# Patient Record
Sex: Male | Born: 1990 | Race: Black or African American | Hispanic: No | Marital: Single | Smoking: Current every day smoker
Health system: Southern US, Community
[De-identification: ages and names within clinical notes are randomized; demographics above are authoritative.]

## PROBLEM LIST (undated history)

## (undated) HISTORY — PX: TONSILLECTOMY: SUR1361

---
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
## 2008-12-29 ENCOUNTER — Emergency Department (HOSPITAL_COMMUNITY): Admission: EM | Admit: 2008-12-29 | Discharge: 2008-12-29 | Payer: Self-pay | Admitting: Emergency Medicine

## 2009-02-12 ENCOUNTER — Emergency Department (HOSPITAL_COMMUNITY): Admission: EM | Admit: 2009-02-12 | Discharge: 2009-02-12 | Payer: Self-pay | Admitting: Emergency Medicine

## 2009-05-06 ENCOUNTER — Emergency Department (HOSPITAL_COMMUNITY): Admission: EM | Admit: 2009-05-06 | Discharge: 2009-05-06 | Payer: Self-pay | Admitting: Emergency Medicine

## 2010-04-14 ENCOUNTER — Emergency Department (HOSPITAL_COMMUNITY): Admission: EM | Admit: 2010-04-14 | Discharge: 2010-04-14 | Payer: Self-pay | Admitting: Emergency Medicine

## 2010-08-05 LAB — COMPREHENSIVE METABOLIC PANEL
ALT: 22 U/L (ref 0–53)
AST: 37 U/L (ref 0–37)
Albumin: 4.3 g/dL (ref 3.5–5.2)
Alkaline Phosphatase: 60 U/L (ref 39–117)
CO2: 24 mEq/L (ref 19–32)
GFR calc Af Amer: >60 mL/min (ref >60)
Sodium: 137 mEq/L (ref 135–145)
Total Protein: 8.2 g/dL (ref 6.0–8.3)

## 2010-08-05 LAB — CBC
MCH: 30.8 pg (ref 26.0–34.0)
MCHC: 33.5 g/dL (ref 30.0–36.0)
RDW: 13.1 % (ref 11.5–15.5)

## 2010-08-05 LAB — DIFFERENTIAL
Basophils Absolute: 0 10*3/uL (ref 0.0–0.1)
Eosinophils Absolute: 0 10*3/uL (ref 0.0–0.7)
Monocytes Absolute: 0.2 10*3/uL (ref 0.1–1.0)
Monocytes Relative: 3 % (ref 3–12)

## 2010-08-05 LAB — LIPASE, BLOOD: Lipase: 21 U/L (ref 11–59)

## 2010-08-29 LAB — LIPASE, BLOOD: Lipase: 11 U/L (ref 11–59)

## 2010-08-29 LAB — DIFFERENTIAL
Eosinophils Absolute: 0 10*3/uL (ref 0.0–0.7)
Eosinophils Relative: 0 % (ref 0–5)
Monocytes Absolute: 0 10*3/uL — ABNORMAL LOW (ref 0.1–1.0)
Neutrophils Relative %: 91 % — ABNORMAL HIGH (ref 43–77)

## 2010-08-29 LAB — COMPREHENSIVE METABOLIC PANEL
ALT: 47 U/L (ref 0–53)
Albumin: 4.7 g/dL (ref 3.5–5.2)
CO2: 26 mEq/L (ref 19–32)
Creatinine, Ser: 1.02 mg/dL (ref 0.4–1.5)
GFR calc Af Amer: >60 mL/min (ref >60)
GFR calc non Af Amer: >60 mL/min (ref >60)
Glucose, Bld: 84 mg/dL (ref 70–99)
Sodium: 140 mEq/L (ref 135–145)
Total Protein: 8.4 g/dL — ABNORMAL HIGH (ref 6.0–8.3)

## 2010-08-29 LAB — CBC
Hemoglobin: 16.3 g/dL (ref 13.0–17.0)
MCV: 90.4 fL (ref 78.0–100.0)
RBC: 5.19 MIL/uL (ref 4.22–5.81)

## 2010-08-29 LAB — URINE MICROSCOPIC-ADD ON

## 2010-08-29 LAB — URINALYSIS, ROUTINE W REFLEX MICROSCOPIC
Bilirubin Urine: NEGATIVE
Specific Gravity, Urine: >1.03 — ABNORMAL HIGH (ref 1.005–1.030)
Urobilinogen, UA: 0.2 mg/dL (ref 0.0–1.0)

## 2011-02-20 LAB — RPR: RPR Ser Ql: NONREACTIVE

## 2011-04-02 ENCOUNTER — Encounter: Payer: Self-pay | Admitting: Emergency Medicine

## 2011-04-02 ENCOUNTER — Emergency Department (HOSPITAL_COMMUNITY)
Admission: EM | Admit: 2011-04-02 | Discharge: 2011-04-02 | Disposition: A | Discharge status: Home / Self Care | Payer: Self-pay | Attending: Emergency Medicine | Admitting: Emergency Medicine

## 2011-04-02 DIAGNOSIS — F17200 Nicotine dependence, unspecified, uncomplicated: Secondary | ICD-10-CM | POA: Insufficient documentation

## 2011-04-02 DIAGNOSIS — IMO0001 Reserved for inherently not codable concepts without codable children: Secondary | ICD-10-CM | POA: Insufficient documentation

## 2011-04-02 MED ORDER — DOXYCYCLINE HYCLATE 100 MG PO CAPS: 100.0000 mg | ORAL_CAPSULE | Freq: Two times a day (BID) | ORAL | Status: AC

## 2011-04-02 NOTE — ED Provider Notes (Signed)
History     CSN: [id number] Arrival date & time: 04/02/2011  1:56 PM   First MD Initiated Contact with Patient 04/02/11 1415      Chief Complaint  Patient presents with  . Abscess    (Consider location/radiation/quality/duration/timing/severity/associated sxs/prior treatment) Patient is a 20 y.o. male presenting with abscess. The history is provided by the patient. No language interpreter was used.  Abscess  This is a new problem. The current episode started yesterday. The problem has been unchanged. Affected Location: medial condyle of R elbow and R lower triceps area. The abscess is characterized by redness and swelling. The abscess first occurred at home. Pertinent negatives include no fever. His past medical history does not include atopy in family or skin abscesses in family. He has received no recent medical care.    History reviewed. No pertinent past medical history.  Past Surgical History  Procedure Date  . Tonsillectomy     History reviewed. No pertinent family history.  History  Substance Use Topics  . Smoking status: Current Everyday Smoker  . Smokeless tobacco: Not on file  . Alcohol Use: Yes     monthly      Review of Systems  Constitutional: Negative for fever.  Skin:       Skin lesion   All other systems reviewed and are negative.    Allergies  Review of patient's allergies indicates no known allergies.  Home Medications  No current outpatient prescriptions on file.  BP 127/76  Pulse 61  Temp(Src) 98.4 F (36.9 C) (Oral)  Resp 16  Ht 5\' 11"  (1.803 m)  Wt 142 lb (64.411 kg)  BMI 19.81 kg/m2  SpO2 100%  Physical Exam  Nursing note and vitals reviewed. Constitutional: He is oriented to person, place, and time. He appears well-developed and well-nourished.  HENT:  Head: Normocephalic and atraumatic.  Eyes: EOM are normal.  Neck: Normal range of motion.  Cardiovascular: Normal rate, regular rhythm, normal heart sounds and intact distal  pulses.   Pulmonary/Chest: Effort normal and breath sounds normal. No respiratory distress.  Abdominal: Soft. He exhibits no distension. There is no tenderness.  Musculoskeletal: Normal range of motion.       Right elbow: He exhibits normal range of motion.       Arms: Neurological: He is alert and oriented to person, place, and time.  Skin: Skin is warm and dry.  Psychiatric: He has a normal mood and affect. Judgment normal.    ED Course  Procedures (including critical care time)  Labs Reviewed - No data to display No results found.   No diagnosis found.    MDM          Worthy Rancher, PA 04/02/11 1610

## 2011-04-02 NOTE — ED Notes (Signed)
Pt c/o insect bite to r  Lateral upper arm and r elbow. States did not see an insect. Denies pain but c/o itching and numbness. elbow is red with slgiht swelling, approx size of golf ball. r lateral arm appears like 3 peas in a row and welted. Nad. No draining

## 2011-04-02 NOTE — ED Notes (Signed)
Pt has 2 raised red welt like areas on rt arm 1 on elbow and 1 on the triceps.  Pt states started yesterday and progressed quickly into these raised areas.  Pt denies pain at site however site is warm to touch and itches.

## 2011-04-03 NOTE — ED Provider Notes (Signed)
Medical screening examination/treatment/procedure(s) were performed by non-physician practitioner and as supervising physician I was immediately available for consultation/collaboration.  Tayquan Gassman L Teylor Wolven, MD 04/03/11 0006 

## 2011-07-10 ENCOUNTER — Emergency Department (HOSPITAL_COMMUNITY): Payer: Self-pay

## 2011-07-10 ENCOUNTER — Encounter (HOSPITAL_COMMUNITY): Payer: Self-pay | Admitting: *Deleted

## 2011-07-10 ENCOUNTER — Emergency Department (HOSPITAL_COMMUNITY)
Admission: EM | Admit: 2011-07-10 | Discharge: 2011-07-10 | Disposition: A | Discharge status: Home / Self Care | Payer: Self-pay | Attending: Emergency Medicine | Admitting: Emergency Medicine

## 2011-07-10 DIAGNOSIS — R109 Unspecified abdominal pain: Secondary | ICD-10-CM | POA: Insufficient documentation

## 2011-07-10 DIAGNOSIS — Z23 Encounter for immunization: Secondary | ICD-10-CM | POA: Insufficient documentation

## 2011-07-10 DIAGNOSIS — S335XXA Sprain of ligaments of lumbar spine, initial encounter: Secondary | ICD-10-CM | POA: Insufficient documentation

## 2011-07-10 DIAGNOSIS — S0510XA Contusion of eyeball and orbital tissues, unspecified eye, initial encounter: Secondary | ICD-10-CM | POA: Insufficient documentation

## 2011-07-10 DIAGNOSIS — Z87891 Personal history of nicotine dependence: Secondary | ICD-10-CM | POA: Insufficient documentation

## 2011-07-10 DIAGNOSIS — S39012A Strain of muscle, fascia and tendon of lower back, initial encounter: Secondary | ICD-10-CM

## 2011-07-10 DIAGNOSIS — S20219A Contusion of unspecified front wall of thorax, initial encounter: Secondary | ICD-10-CM | POA: Insufficient documentation

## 2011-07-10 DIAGNOSIS — S0511XA Contusion of eyeball and orbital tissues, right eye, initial encounter: Secondary | ICD-10-CM

## 2011-07-10 DIAGNOSIS — S01111A Laceration without foreign body of right eyelid and periocular area, initial encounter: Secondary | ICD-10-CM

## 2011-07-10 DIAGNOSIS — S0083XA Contusion of other part of head, initial encounter: Secondary | ICD-10-CM

## 2011-07-10 DIAGNOSIS — S0003XA Contusion of scalp, initial encounter: Secondary | ICD-10-CM | POA: Insufficient documentation

## 2011-07-10 DIAGNOSIS — S301XXA Contusion of abdominal wall, initial encounter: Secondary | ICD-10-CM | POA: Insufficient documentation

## 2011-07-10 DIAGNOSIS — S0990XA Unspecified injury of head, initial encounter: Secondary | ICD-10-CM | POA: Insufficient documentation

## 2011-07-10 DIAGNOSIS — S0180XA Unspecified open wound of other part of head, initial encounter: Secondary | ICD-10-CM | POA: Insufficient documentation

## 2011-07-10 LAB — COMPREHENSIVE METABOLIC PANEL
Albumin: 3.8 g/dL (ref 3.5–5.2)
BUN: 8 mg/dL (ref 6–23)
Calcium: 9.5 mg/dL (ref 8.4–10.5)
Creatinine, Ser: 0.99 mg/dL (ref 0.50–1.35)
Total Protein: 7.4 g/dL (ref 6.0–8.3)

## 2011-07-10 LAB — DIFFERENTIAL
Basophils Absolute: 0.1 10*3/uL (ref 0.0–0.1)
Basophils Relative: 1 % (ref 0–1)
Eosinophils Relative: 1 % (ref 0–5)
Monocytes Absolute: 1 10*3/uL (ref 0.1–1.0)

## 2011-07-10 LAB — URINE MICROSCOPIC-ADD ON

## 2011-07-10 LAB — URINALYSIS, ROUTINE W REFLEX MICROSCOPIC
Glucose, UA: NEGATIVE mg/dL
Hgb urine dipstick: NEGATIVE
Ketones, ur: 15 mg/dL — AB
Leukocytes, UA: NEGATIVE
Urobilinogen, UA: 0.2 mg/dL (ref 0.0–1.0)

## 2011-07-10 LAB — CBC
HCT: 44 % (ref 39.0–52.0)
MCHC: 34.8 g/dL (ref 30.0–36.0)
MCV: 90.7 fL (ref 78.0–100.0)
RDW: 12.9 % (ref 11.5–15.5)

## 2011-07-10 LAB — PROTIME-INR
INR: 1.07 (ref 0.00–1.49)
Prothrombin Time: 14.1 seconds (ref 11.6–15.2)

## 2011-07-10 MED ORDER — TETANUS-DIPHTHERIA TOXOIDS TD 5-2 LFU IM INJ
0.5000 mL | INJECTION | Freq: Once | INTRAMUSCULAR | Status: AC
  Administered 2011-07-10: 0.5 mL via INTRAMUSCULAR
  Filled 2011-07-10: qty 0.5

## 2011-07-10 MED ORDER — IBUPROFEN 600 MG PO TABS: 600.0000 mg | ORAL_TABLET | Freq: Three times a day (TID) | ORAL | Status: AC | PRN

## 2011-07-10 MED ORDER — HYDROCODONE-ACETAMINOPHEN 7.5-500 MG/15ML PO SOLN
10.0000 mL | Freq: Once | ORAL | Status: AC
  Administered 2011-07-10: 10 mL via ORAL
  Filled 2011-07-10: qty 15

## 2011-07-10 MED ORDER — IOHEXOL 300 MG/ML  SOLN
100.0000 mL | Freq: Once | INTRAMUSCULAR | Status: AC | PRN
  Administered 2011-07-10: 100 mL via INTRAVENOUS

## 2011-07-10 MED ORDER — HYDROCODONE-ACETAMINOPHEN 5-325 MG PO TABS: 2.0000 | ORAL_TABLET | ORAL | Status: AC | PRN

## 2011-07-10 NOTE — ED Notes (Signed)
Laceration over right eye cleansed with sterile normal saline and peroxide. Patient tolerated well.
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
## 2011-07-10 NOTE — ED Notes (Signed)
Patient with no complaints at this time. Respirations even and unlabored. Skin warm/dry. Discharge instructions reviewed with patient at this time. Patient given opportunity to voice concerns/ask questions. IV removed per policy and band-aid applied to site. Patient discharged at this time and left Emergency Department with steady gait.  
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
## 2011-10-05 ENCOUNTER — Emergency Department (HOSPITAL_COMMUNITY)
Admission: EM | Admit: 2011-10-05 | Discharge: 2011-10-05 | Disposition: A | Discharge status: Home / Self Care | Payer: Self-pay | Attending: Emergency Medicine | Admitting: Emergency Medicine

## 2011-10-05 ENCOUNTER — Encounter (HOSPITAL_COMMUNITY): Payer: Self-pay | Admitting: *Deleted

## 2011-10-05 DIAGNOSIS — F17200 Nicotine dependence, unspecified, uncomplicated: Secondary | ICD-10-CM | POA: Insufficient documentation

## 2011-10-05 DIAGNOSIS — S81851A Open bite, right lower leg, initial encounter: Secondary | ICD-10-CM

## 2011-10-05 DIAGNOSIS — W540XXA Bitten by dog, initial encounter: Secondary | ICD-10-CM | POA: Insufficient documentation

## 2011-10-05 DIAGNOSIS — S91009A Unspecified open wound, unspecified ankle, initial encounter: Secondary | ICD-10-CM | POA: Insufficient documentation

## 2011-10-05 DIAGNOSIS — S81009A Unspecified open wound, unspecified knee, initial encounter: Secondary | ICD-10-CM | POA: Insufficient documentation

## 2011-10-05 DIAGNOSIS — Z23 Encounter for immunization: Secondary | ICD-10-CM | POA: Insufficient documentation

## 2011-10-05 DIAGNOSIS — Y92009 Unspecified place in unspecified non-institutional (private) residence as the place of occurrence of the external cause: Secondary | ICD-10-CM | POA: Insufficient documentation

## 2011-10-05 MED ORDER — AMOXICILLIN-POT CLAVULANATE 875-125 MG PO TABS: 1.0000 | ORAL_TABLET | Freq: Two times a day (BID) | ORAL | Status: AC

## 2011-10-05 MED ORDER — RABIES IMMUNE GLOBULIN 150 UNIT/ML IM INJ
20.0000 [IU]/kg | INJECTION | Freq: Once | INTRAMUSCULAR | Status: AC
  Administered 2011-10-05: 1350 [IU] via INTRAMUSCULAR
  Filled 2011-10-05: qty 9

## 2011-10-05 MED ORDER — RABIES VACCINE, PCEC IM SUSR
1.0000 mL | Freq: Once | INTRAMUSCULAR | Status: AC
  Administered 2011-10-05: 1 mL via INTRAMUSCULAR
  Filled 2011-10-05: qty 1

## 2011-10-05 MED ORDER — BACITRACIN ZINC 500 UNIT/GM EX OINT
TOPICAL_OINTMENT | CUTANEOUS | Status: AC
  Filled 2011-10-05: qty 0.9

## 2011-10-05 MED ORDER — AMOXICILLIN-POT CLAVULANATE 875-125 MG PO TABS
1.0000 | ORAL_TABLET | Freq: Once | ORAL | Status: AC
  Administered 2011-10-05: 1 via ORAL
  Filled 2011-10-05: qty 1
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
## 2011-10-05 NOTE — ED Provider Notes (Signed)
History     CSN: [id number]  Arrival date & time 10/05/11  1451   First MD Initiated Contact with Patient 10/05/11 1611      Chief Complaint  Patient presents with  . Animal Bite    (Consider location/radiation/quality/duration/timing/severity/associated sxs/prior treatment) HPI Comments: Pt was  At his uncle's house.  As he was walking inside he was bit by a "pit bull" that he thinks lives in the neighborhood.  No exact is information is available presently on the dog's owner or the rabies vaccination status.  The pt had a tetanus shot in February 2013.  Patient is a 21 y.o. male presenting with animal bite. The history is provided by the patient. No language interpreter was used.  Animal Bite  Episode onset: yest PM. Pertinent negatives include no numbness and no weakness.    History reviewed. No pertinent past medical history.  Past Surgical History  Procedure Date  . Tonsillectomy     History reviewed. No pertinent family history.  History  Substance Use Topics  . Smoking status: Current Everyday Smoker    Types: Cigarettes  . Smokeless tobacco: Not on file  . Alcohol Use: Yes     monthly      Review of Systems  Constitutional: Negative for fever.  Skin: Positive for wound.  Neurological: Negative for weakness and numbness.  All other systems reviewed and are negative.    Allergies  Review of patient's allergies indicates no known allergies.  Home Medications   Current Outpatient Rx  Name Route Sig Dispense Refill  . AMOXICILLIN-POT CLAVULANATE 875-125 MG PO TABS Oral Take 1 tablet by mouth every 12 (twelve) hours. 14 tablet 0    BP 117/71  Pulse 98  Temp(Src) 98.1 F (36.7 C) (Oral)  Resp 17  Ht 5\' 11"  (1.803 m)  Wt 145 lb 7 oz (65.97 kg)  BMI 20.28 kg/m2  SpO2 98%  Physical Exam  Nursing note and vitals reviewed. Constitutional: He is oriented to person, place, and time. He appears well-developed and well-nourished.  HENT:  Head:  Normocephalic and atraumatic.  Eyes: EOM are normal.  Neck: Normal range of motion.  Cardiovascular: Normal rate, regular rhythm, normal heart sounds and intact distal pulses.   Pulmonary/Chest: Effort normal and breath sounds normal. No respiratory distress.  Abdominal: Soft. He exhibits no distension. There is no tenderness.  Musculoskeletal: Normal range of motion. He exhibits tenderness.       Right knee: He exhibits laceration. He exhibits normal range of motion, no swelling and no effusion.       Legs: Neurological: He is alert and oriented to person, place, and time.  Skin: Skin is warm and dry.  Psychiatric: He has a normal mood and affect. Judgment normal.    ED Course  Procedures (including critical care time)  Labs Reviewed - No data to display No results found.   1. Animal bite of lower leg, right, initial encounter   2. Need for rabies vaccination       MDM  rx- augmentin 875 mg, BID, 14 DT UTD Clean BID with soap and water. F/u with animal control. Complete the remaining rabies series as directed.        Worthy Rancher, PA 10/05/11 [phone]

## 2011-10-05 NOTE — Discharge Instructions (Signed)
Animal Bite Animal bite wounds can get infected. It is important to get proper medical treatment. Ask your doctor if you need a rabies shot. HOME CARE   Follow your doctor's instructions for taking care of your wound.   Only take medicine as told by your doctor.   Take your medicine (antibiotics) as told. Finish them even if you start to feel better.   Keep all doctor visits as told.  You may need a tetanus shot if:   You cannot remember when you had your last tetanus shot.   You have never had a tetanus shot.   The injury broke your skin.  If you need a tetanus shot and you choose not to have one, you may get tetanus. Sickness from tetanus can be serious. GET HELP RIGHT AWAY IF:   Your wound is warm, red, sore, or puffy (swollen).   You notice yellowish-white fluid (pus) or a bad smell coming from the wound.   You see a red line on the skin coming from the wound.   You have a fever, chills, or you feel sick.   You feel sick to your stomach (nauseous), or you throw up (vomit).   Your pain does not go away, or it gets worse.   You have trouble moving the injured part.   You have questions or concerns.  MAKE SURE YOU:   Understand these instructions.   Will watch your condition.   Will get help right away if you are not doing well or get worse.  Document Released: 05/11/2005 Document Revised: 04/30/2011 Document Reviewed: 12/31/2010 [facility] Patient Information 2012 [place], [place].Rabies Vaccine What You Need to Know WHAT IS RABIES?  Rabies is a serious disease. It is caused by a virus.   Rabies is mainly a disease of animals. Humans get rabies when they are bitten by infected animals.   At first there might not be any symptoms. Weeks, or even years after a bite, rabies can cause pain, fatigue, headaches, fever, and irritability. These are followed by seizures, hallucinations, and paralysis. Rabies is almost always fatal.   Wild animals, especially bats, are  common source of human rabies infection. Skunks, raccoons, dogs, cats, coyotes, foxes and other mammals can also transmit the disease.   Human rabies is rare in the [place]. There have been only 55 cases diagnosed since 1990. However, between 16,000 and 39,000 people are vaccinated each year as a precaution after animal bites. Also, rabies is far more common in other parts of the world, with about 40,000 to 70,000 rabies-related deaths worldwide each year. Bites from unvaccinated dogs cause most of these cases.  Rabies vaccine can prevent rabies. RABIES VACCINE  Rabies vaccine is given to people at high risk of rabies to protect them if they are exposed. It can also prevent the disease if it is given to a person after they have been exposed.   Rabies vaccine is made from killed rabies virus. It cannot cause rabies.  WHO SHOULD GET RABIES VACCINE AND WHEN? Preventive Vaccination (No Exposure)  People at high risk of exposure to rabies, such as veterinarians, Educational psychologist, rabies laboratory workers, spelunkers, and rabies biologics production workers should be offered rabies vaccine.   The vaccine should also be considered for:   People whose activities bring them into frequent contact with rabies virus or with possibly rabid animals.   International travelers who are likely to come in contact with animals in parts of the world where rabies is common.  The pre-exposure schedule for rabies vaccination is 3 doses, given at the following times:   Dose 1: As appropriate.   Dose 2: 7 days after Dose 1.   Dose 3: 21 days or 28 days after Dose 1.   For laboratory workers and others who may be repeatedly exposed to rabies virus, periodic testing for immunity is recommended. Booster doses should be given as needed. (Testing or booster doses are not recommended for travelers). Ask your caregiver for details.  Vaccination After an Exposure Anyone who has been bitten by an animal, or who  otherwise may have been exposed to rabies, should see a caregiver immediately. The caregiver will determine if he or she needs to be vaccinated.  A person who is exposed and has never been vaccinated against rabies should get 4 doses of rabies vaccine.One dose right away and additional doses on the 3rd, 7th, and 14th days. They should also get another shot called RabiesImmuneGlobulin at the same time as the first dose.   A person who has been previously vaccinated should get 2 doses of rabies vaccine. One right away and another on the 3rd day. Rabies Immune Globulin is not needed.  TELL YOUR CAREGIVER IF: Talk with a caregiver before getting rabies vaccine if you:  Ever had a serious (life-threatening) allergic reaction to a previous dose of rabies vaccine or to any component of the vaccine.   Have any severe allergies.   Have a weakened immune system because of:   HIV, AIDS, or another disease that affects the immune system.   Treatment with drugs that affect the immune system, such as steroids.   Cancer or cancer treatment with radiation or drugs.  If you have a minor illnesses, such as a cold, you can be vaccinated. If you are moderately or severely ill, wait until you recover before getting a routine (non-exposure) dose of rabies vaccine. If you have been exposed to rabies virus, you should get the vaccine regardless of any other illnesses you may have. WHAT ARE THE RISKS FROM RABIES VACCINE? A vaccine, like any medicine, is capable of causing serious problems, such as severe allergic reactions. The risk of a vaccine causing serious harm, or death, is extremely small. Serious problems from rabies vaccine are very rare.  Mild problems:  Soreness, redness, swelling, or itching where the shot was given (30 % to 74 %).   Headache, nausea, abdominal pain, muscle aches, or dizziness (5% to 40 %).  Moderate problems:  Hives, pain in the joints, or fever (about 6 % of booster doses).    Other nervous system disorders, such as Guillain-Barr Syndrome (GBS), have been reported after rabies vaccine. This happens so rarely that it is not known whether they are related to the vaccine.  Note: Several brands of rabies vaccine are available in the [place], and reactions may vary between brands. Your caregiver can give you more information about a particular brand. WHAT IF THERE IS A MODERATE OR SEVERE REACTION? What should I look for? Any unusual condition, such as a severe allergic reaction or a high fever. If a severe allergic reaction occurred, it would be within a few minutes to an hour after the shot. Signs of a serious allergic reaction can include difficulty breathing, weakness, hoarseness or wheezing, a fast heartbeat, hives, dizziness, paleness, or swelling of the throat. What should I do?  Call your caregiver or get the person to a caregiver right away.   Tell the caregiver what happened, the  date and time it happened, and when the vaccination was given.   Ask the caregiver, nurse, or health department to report the reaction by filing a Vaccine Adverse Event Reporting System (VAERS) form. Or, you can file this report through the VAERS website at www.vaers.[place]gents.no or by calling 1-(423)551-7699.  VAERS does not provide medical advice. HOW CAN I LEARN MORE?  Ask your caregiver or other health care provider. He or she can give you the vaccine package insert or suggest other sources of information.   Call your local or state health department.   Contact the Centers for Disease Control and Prevention (CDC):   Visit the CDC rabies website at [place].no  CDC Rabies Vaccine VIS (02/28/08) Document Released: 03/08/2006 Document Revised: 04/30/2011 Document Reviewed: 02/28/2008 [facility] Patient Information 2012 [place], [place].   Take the antibiotic as directed.  You need to complete the rabies vaccination series as outlined.  Stay in touch with animal  control.  Return to the ED if any signs of infection at the site of the bite.

## 2011-10-05 NOTE — ED Notes (Signed)
R. Miller, PA at bedside  

## 2011-10-05 NOTE — ED Notes (Signed)
[employer] called and an officer is to be sent here to speak with pt

## 2011-10-08 ENCOUNTER — Ambulatory Visit (HOSPITAL_COMMUNITY): Payer: Self-pay

## 2011-10-08 NOTE — ED Provider Notes (Signed)
Medical screening examination/treatment/procedure(s) were performed by non-physician practitioner and as supervising physician I was immediately available for consultation/collaboration.  Yumna Ebers, MD 10/08/11 0716 

## 2011-10-12 ENCOUNTER — Ambulatory Visit (HOSPITAL_COMMUNITY): Payer: Self-pay

## 2011-10-20 ENCOUNTER — Ambulatory Visit (HOSPITAL_COMMUNITY): Payer: Self-pay

## 2015-02-04 ENCOUNTER — Encounter (HOSPITAL_COMMUNITY): Payer: Self-pay

## 2015-02-04 ENCOUNTER — Emergency Department (HOSPITAL_COMMUNITY)
Admission: EM | Admit: 2015-02-04 | Discharge: 2015-02-04 | Discharge status: Against Medical Advice | Payer: Self-pay | Attending: Emergency Medicine | Admitting: Emergency Medicine

## 2015-02-04 DIAGNOSIS — Y9389 Activity, other specified: Secondary | ICD-10-CM | POA: Insufficient documentation

## 2015-02-04 DIAGNOSIS — Y9289 Other specified places as the place of occurrence of the external cause: Secondary | ICD-10-CM | POA: Insufficient documentation

## 2015-02-04 DIAGNOSIS — F1410 Cocaine abuse, uncomplicated: Secondary | ICD-10-CM | POA: Insufficient documentation

## 2015-02-04 DIAGNOSIS — Y998 Other external cause status: Secondary | ICD-10-CM | POA: Insufficient documentation

## 2015-02-04 DIAGNOSIS — F32A Depression, unspecified: Secondary | ICD-10-CM

## 2015-02-04 DIAGNOSIS — Z720 Tobacco use: Secondary | ICD-10-CM | POA: Insufficient documentation

## 2015-02-04 DIAGNOSIS — F329 Major depressive disorder, single episode, unspecified: Secondary | ICD-10-CM | POA: Insufficient documentation

## 2015-02-04 DIAGNOSIS — F1210 Cannabis abuse, uncomplicated: Secondary | ICD-10-CM | POA: Insufficient documentation

## 2015-02-04 LAB — COMPREHENSIVE METABOLIC PANEL
ALK PHOS: 56 U/L (ref 38–126)
ALT: 12 U/L — ABNORMAL LOW (ref 17–63)
ANION GAP: 5 (ref 5–15)
AST: 18 U/L (ref 15–41)
Albumin: 4.1 g/dL (ref 3.5–5.0)
BILIRUBIN TOTAL: 1.5 mg/dL — AB (ref 0.3–1.2)
BUN: 11 mg/dL (ref 6–20)
CALCIUM: 8.6 mg/dL — AB (ref 8.9–10.3)
CO2: 25 mmol/L (ref 22–32)
Chloride: 108 mmol/L (ref 101–111)
Creatinine, Ser: 1.39 mg/dL — ABNORMAL HIGH (ref 0.61–1.24)
GFR calc Af Amer: >60 mL/min (ref >60)
GLUCOSE: 108 mg/dL — AB (ref 65–99)
POTASSIUM: 3.8 mmol/L (ref 3.5–5.1)
Sodium: 138 mmol/L (ref 135–145)
TOTAL PROTEIN: 7.1 g/dL (ref 6.5–8.1)

## 2015-02-04 LAB — CBC
HEMATOCRIT: 43.5 % (ref 39.0–52.0)
HEMOGLOBIN: 15 g/dL (ref 13.0–17.0)
MCH: 31.7 pg (ref 26.0–34.0)
MCHC: 34.5 g/dL (ref 30.0–36.0)
MCV: 92 fL (ref 78.0–100.0)
Platelets: 201 10*3/uL (ref 150–400)
RBC: 4.73 MIL/uL (ref 4.22–5.81)
RDW: 12.6 % (ref 11.5–15.5)
WBC: 4.5 10*3/uL (ref 4.0–10.5)

## 2015-02-04 LAB — ACETAMINOPHEN LEVEL: Acetaminophen (Tylenol), Serum: <10 ug/mL — ABNORMAL LOW (ref 10–30)

## 2015-02-04 LAB — RAPID URINE DRUG SCREEN, HOSP PERFORMED
AMPHETAMINES: NOT DETECTED
Barbiturates: NOT DETECTED
Benzodiazepines: NOT DETECTED
Cocaine: POSITIVE — AB
OPIATES: NOT DETECTED
TETRAHYDROCANNABINOL: POSITIVE — AB

## 2015-02-04 LAB — ETHANOL

## 2015-02-04 LAB — SALICYLATE LEVEL

## 2015-02-04 NOTE — ED Notes (Signed)
Pt resting, equal rise and fall of chest noted.

## 2015-02-04 NOTE — BH Assessment (Addendum)
Tele Assessment Note   Aaron Francis is an 24 y.o. male who presents voluntarily to [facility] with complaints of feeling weak and feeling like his inside body temperature was very hot. Pt admits to talking 15 pills of an unknown rx with intent to kill himself. Pt states that the pills could have been from his finance or her son, both of whom he lives with. He discloses that both of them take psychotropic meds. Pt shares that the bottle was unlabeled.   Pt discloses feeling overwhelmed with normal life issues that have compounded on him at once. Pt endorses feeling some depression with symptoms of anger/irritation, worthlessness, and anhedonia. Pt denies any current SI/HI or AVH and indicates that he would just like to go home. Pt reports no hx of mental health treatment. Pt indicates no drug/alcohol abuse or treatment hx.   Pt is oriented x 4. His mood is pleasant, but depressed.   Axis I: 296.23 Major depressive disorder, Single episode, Severe Axis II: Deferred Axis III: History reviewed. No pertinent past medical history. Axis IV: other psychosocial or environmental problems Axis V: 11-20 some danger of hurting self or others possible OR occasionally fails to maintain minimal personal hygiene OR gross impairment in communication  Past Medical History: History reviewed. No pertinent past medical history.  Past Surgical History  Procedure Laterality Date  . Tonsillectomy      Family History: History reviewed. No pertinent family history.  Social History:  reports that he has been smoking Cigarettes.  He does not have any smokeless tobacco history on file. He reports that he drinks alcohol. He reports that he does not use illicit drugs.  Additional Social History:  Alcohol / Drug Use Pain Medications: see MAR Prescriptions: see MAR Over the Counter: see MAR History of alcohol / drug use?: No history of alcohol / drug abuse  CIWA: CIWA-Ar BP: 113/65 mmHg Pulse Rate: 66 COWS:     PATIENT STRENGTHS: (choose at least two) Average or above average intelligence Capable of independent living Communication skills Motivation for treatment/growth Supportive family/friends  Allergies: No Known Allergies  Home Medications:  (Not in a hospital admission)  OB/GYN Status:  No LMP for male patient.  General Assessment Data Location of Assessment: [facility] TTS Assessment: In system Is this a Tele or Face-to-Face Assessment?: Tele Assessment Is this an Initial Assessment or a Re-assessment for this encounter?: Initial Assessment Marital status: Long term relationship (engaged) Is patient pregnant?: No Pregnancy Status: No Living Arrangements: Spouse/significant other, Children Can pt return to current living arrangement?: Yes Admission Status: Voluntary Is patient capable of signing voluntary admission?: Yes Referral Source: Self/Family/Friend Insurance type: none  Medical Screening Exam [facility] Walk-in ONLY) Medical Exam completed: Yes  Crisis Care Plan Living Arrangements: Spouse/significant other, Children Name of Psychiatrist: none Name of Therapist: none  Education Status Is patient currently in school?: No  Risk to self with the past 6 months Suicidal Ideation: No-Not Currently/Within Last 6 Months Has patient been a risk to self within the past 6 months prior to admission? : Yes Suicidal Intent: No-Not Currently/Within Last 6 Months Has patient had any suicidal intent within the past 6 months prior to admission? : Yes Is patient at risk for suicide?: Yes Suicidal Plan?: No-Not Currently/Within Last 6 Months Has patient had any suicidal plan within the past 6 months prior to admission? : Yes Access to Means: Yes Specify Access to Suicidal Means: fiance and fiance's son has MH rxs What has been your use  of drugs/alcohol within the last 12 months?: pt reports no hx Previous Attempts/Gestures: Yes How many times?: 1 Other Self Harm Risks: none Triggers  for Past Attempts: Other (Comment) (overwhelming life circumstances) Intentional Self Injurious Behavior: None Family Suicide History: No Recent stressful life event(s): Other (Comment) (compounding normal life issues) Persecutory voices/beliefs?: No Depression: Yes Depression Symptoms: Feeling angry/irritable, Feeling worthless/self pity, Loss of interest in usual pleasures Substance abuse history and/or treatment for substance abuse?: No Suicide prevention information given to non-admitted patients: Not applicable  Risk to Others within the past 6 months Homicidal Ideation: No Does patient have any lifetime risk of violence toward others beyond the six months prior to admission? : No Thoughts of Harm to Others: No Current Homicidal Intent: No Current Homicidal Plan: No Access to Homicidal Means: No History of harm to others?: No Assessment of Violence: None Noted Does patient have access to weapons?: No Criminal Charges Pending?: No Does patient have a court date: No Is patient on probation?: No  Psychosis Hallucinations: None noted Delusions: None noted  Mental Status Report Appearance/Hygiene: Unremarkable Eye Contact: Good Motor Activity: Unremarkable Speech: Unremarkable, Logical/coherent Level of Consciousness: Quiet/awake Mood: Depressed, Pleasant Affect: Depressed, Appropriate to circumstance, Anxious Anxiety Level: Minimal Thought Processes: Coherent, Relevant Judgement: Partial Orientation: Person, Place, Time, Situation Obsessive Compulsive Thoughts/Behaviors: None  Cognitive Functioning Concentration: Normal Memory: Recent Intact, Remote Intact IQ: Average Insight: Fair Impulse Control: Fair Appetite: Good Sleep: Decreased Vegetative Symptoms: None  ADLScreening [facility] Assessment Services) Patient's cognitive ability adequate to safely complete daily activities?: Yes Patient able to express need for assistance with ADLs?: Yes Independently performs  ADLs?: Yes (appropriate for developmental age)  Prior Inpatient Therapy Prior Inpatient Therapy: No  Prior Outpatient Therapy Prior Outpatient Therapy: No Does patient have an ACCT team?: No Does patient have Intensive In-House Services?  : No Does patient have [facility] services? : No Does patient have P4CC services?: No  ADL Screening (condition at time of admission) Patient's cognitive ability adequate to safely complete daily activities?: Yes Is the patient deaf or have difficulty hearing?: No Does the patient have difficulty seeing, even when wearing glasses/contacts?: No Does the patient have difficulty concentrating, remembering, or making decisions?: No Patient able to express need for assistance with ADLs?: Yes Does the patient have difficulty dressing or bathing?: No Independently performs ADLs?: Yes (appropriate for developmental age) Does the patient have difficulty walking or climbing stairs?: No Weakness of Legs: None Weakness of Arms/Hands: None  Home Assistive Devices/Equipment Home Assistive Devices/Equipment: None  Therapy Consults (therapy consults require a physician order) PT Evaluation Needed: No OT Evalulation Needed: No SLP Evaluation Needed: No Abuse/Neglect Assessment (Assessment to be complete while patient is alone) Physical Abuse: Denies Verbal Abuse: Denies Sexual Abuse: Denies Exploitation of patient/patient's resources: Denies Self-Neglect: Denies Values / Beliefs Spiritual Requests During Hospitalization: None Consults Spiritual Care Consult Needed: No Social Work Consult Needed: No Merchant navy officer (For Healthcare) Does patient have an advance directive?: No    Additional Information 1:1 In Past 12 Months?: No CIRT Risk: No Elopement Risk: No Does patient have medical clearance?: Yes     Disposition:  Disposition Initial Assessment Completed for this Encounter: Yes Disposition of Patient: Inpatient treatment program (per  Claudette Head, DNP) Type of inpatient treatment program: Adult  Laddie Aquas 02/04/2015 9:20 AM

## 2015-02-04 NOTE — ED Provider Notes (Signed)
CSN: [id number]     Arrival date & time 02/04/15  4098 History  This chart was scribed for Azalia Bilis, MD by Marica Otter, ED Scribe. This patient was seen in room APA16A/AP[facility]16A and the patient's care was started at 8:02 AM.   Chief Complaint  Patient presents with  . Ingestion   The history is provided by the patient and a friend Soil scientist ).   PCP: No primary care provider on file. HPI Comments: HAARIS METALLO is a 24 y.o. male, with PMHx noted below, who presents to the Emergency Department complaining of ingesting 15 unknown pills last night because he was depressed and suicidal. Pt reports the pills belonged to his fiance. Pt denies any prior Hx of suicide attempts. Pt denies drug and EtOH use. Today, pt complains of generalized weakness onset this morning. Pt denies n/v. Per pt's fiance the her meds include: Abilify, cymbalata, ibuprofen, cholesterol med, trazodone (fluid pills).   History reviewed. No pertinent past medical history. Past Surgical History  Procedure Laterality Date  . Tonsillectomy     No family history on file. Social History  Substance Use Topics  . Smoking status: Current Every Day Smoker    Types: Cigarettes  . Smokeless tobacco: None  . Alcohol Use: Yes     Comment: occ    Review of Systems  Constitutional: Negative for fever and chills.  Gastrointestinal: Negative for nausea and vomiting.  Neurological: Positive for weakness.  Psychiatric/Behavioral: Negative for suicidal ideas.  All other systems reviewed and are negative.  Allergies  Review of patient's allergies indicates no known allergies.  Home Medications   Prior to Admission medications   Not on File   Triage Vitals: BP 113/65 mmHg  Pulse 66  Temp(Src) 97.8 F (36.6 C) (Oral)  Resp 18  Ht  (1.803 m)  Wt 150 lb (68.04 kg)  BMI 20.93 kg/m2  SpO2 98% Physical Exam  Constitutional: He is oriented to person, place, and time. He appears well-developed and well-nourished.   HENT:  Head: Normocephalic and atraumatic.  Eyes: EOM are normal.  Neck: Normal range of motion.  Cardiovascular: Normal rate, regular rhythm, normal heart sounds and intact distal pulses.   Pulmonary/Chest: Effort normal and breath sounds normal. No respiratory distress.  Abdominal: Soft. He exhibits no distension. There is no tenderness.  Musculoskeletal: Normal range of motion.  Neurological: He is alert and oriented to person, place, and time.  Skin: Skin is warm and dry.  Psychiatric: He has a normal mood and affect. Judgment normal.  Nursing note and vitals reviewed.   ED Course  Procedures (including critical care time) DIAGNOSTIC STUDIES: Oxygen Saturation is 98% on RA, nl by my interpretation.    COORDINATION OF CARE: 8:05 AM: Discussed treatment plan which includes labs and psych consult with pt at bedside; patient verbalizes understanding and agrees with treatment plan.  Labs Review Labs Reviewed  COMPREHENSIVE METABOLIC PANEL - Abnormal; Notable for the following:    Glucose, Bld 108 (*)    Creatinine, Ser 1.39 (*)    Calcium 8.6 (*)    ALT 12 (*)    Total Bilirubin 1.5 (*)    All other components within normal limits  ACETAMINOPHEN LEVEL - Abnormal; Notable for the following:    Acetaminophen (Tylenol), Serum <10 (*)    All other components within normal limits  URINE RAPID DRUG SCREEN, HOSP PERFORMED - Abnormal; Notable for the following:    Cocaine POSITIVE (*)    Tetrahydrocannabinol POSITIVE (*)  All other components within normal limits  CBC  SALICYLATE LEVEL  ETHANOL       MDM   Final diagnoses:  None    Medically clear. TTS to evaluate  I personally performed the services described in this documentation, which was scribed in my presence. The recorded information has been reviewed and is accurate.      Azalia Bilis, MD 02/04/15 [phone]

## 2015-02-04 NOTE — [facility] Counselor (Signed)
[facility] Assessment Progress Note  Per Claudette Head, DNP, pt meets criteria for IP hospitalization. No available beds at [facility]. TTS to seek placement. Counselor advised pt's nurse, Lauren, of the disposition and she agreed to inform EDP, Dr. Patria Mane.   Johny Shock. Ladona Ridgel, MS, NCC, LPCA Counselor

## 2015-02-04 NOTE — Progress Notes (Signed)
Seeking inpatient placement for pt.  Referred to: [facility]- per Adonis Brook- per [facility]- per [facility] per Dina  Left voicemail with [facility] inquiring as to bed status- will refer if appropriate. Other facilities contacted are currently at capacity.  Ilean Skill, MSW, LCSW Clinical Social Work, Disposition  02/04/2015 [phone]

## 2015-02-04 NOTE — ED Notes (Signed)
Pt made aware of policies regarding making pt IVC if he tries to leave. Pt is ok with staying at this time. Pt is currently voluntary.

## 2015-02-04 NOTE — ED Notes (Signed)
MD made aware and re-evaluating the patient.

## 2015-02-04 NOTE — ED Notes (Signed)
Pt has family member at bedside.   Pt and family now want pt to go home because he has a job to go to tomorrow. Pt made aware that he has had SI intentions in the past 24 hours and it is not safe for him to go home.   Pt and family made aware that pt is now voluntary but he will become IVC'd if he doesn't cooperate with staff and makes threats to leave.  Charge nurse made aware and currently talking with pt and family member. Security at nurses station.

## 2015-02-04 NOTE — ED Notes (Addendum)
Pt dressed in wine scrubs and wanded by security. Pt ambulatory to bathroom.

## 2015-02-04 NOTE — ED Notes (Signed)
Pt states he was not going to stay because he had to work tomorrow and needed to go so he could pay child support. We explained to him that if he tried to leave that we would have to take out IVC paperwork on him. Pt stated he would "take his chances with the IVC paperwork" and promptly got up and left the building. Paperwork started by EDP. [employer] notified to bring pt back as he was now under IVC.

## 2015-02-04 NOTE — ED Notes (Signed)
Pt resting quietly with NAD noted. Lights turned off for patient to rest.

## 2015-02-04 NOTE — ED Notes (Signed)
Spoke with marcus at [facility] to update him on pt's IVC status.

## 2015-02-04 NOTE — ED Notes (Signed)
Aaron Francis at [facility] made aware of the situation and pt and facility request for re-TTS. Minerva Areola reports to place the order in now and TTS consult won't be made again until atleast 2000

## 2015-02-04 NOTE — ED Notes (Signed)
Aaron Francis [phone]

## 2015-02-04 NOTE — ED Notes (Signed)
Pt given meal tray.   Pt has one visitor at bedside

## 2015-02-04 NOTE — ED Notes (Signed)
Pt reports was feeling depressed last night and took 15 tablets of an unlabeled medication.  Pt says he took the medication around 1930 last night.  C/o generalized weakness, headache, feeling like is "hot on the inside."    Pt says he no longer wants to hurt himself.

## 2015-02-04 NOTE — Progress Notes (Addendum)
Referral was followed up at: [facility]- per [place], at capacity today, referrals have not been reviewed yet, call back in am. [facility] - per Dyn, fax referral over, beds open. [facility] - per Misty Stanley, will call writer back with updates. [facility] - per intake, fax referral for the waitlist. [facility] - left voicemail.  Declined at: [facility]   At capacity: [address] [facility]   CSW will continue to seek placement.  Melbourne Abts, LCSWA Disposition staff 02/04/2015 6:37 PM

## 2015-02-04 NOTE — ED Notes (Signed)
TTS machine placed at bedside.   

## 2015-02-04 NOTE — ED Notes (Signed)
[facility] called, they will seek inpatient for patient.

## 2015-02-04 NOTE — ED Notes (Signed)
MD at bedside. 

## 2015-06-11 ENCOUNTER — Encounter (HOSPITAL_COMMUNITY): Payer: Self-pay | Admitting: Emergency Medicine

## 2015-06-11 ENCOUNTER — Emergency Department (HOSPITAL_COMMUNITY)
Admission: EM | Admit: 2015-06-11 | Discharge: 2015-06-11 | Disposition: A | Discharge status: Home / Self Care | Payer: Self-pay | Attending: Emergency Medicine | Admitting: Emergency Medicine

## 2015-06-11 DIAGNOSIS — Z008 Encounter for other general examination: Secondary | ICD-10-CM | POA: Insufficient documentation

## 2015-06-11 DIAGNOSIS — F17210 Nicotine dependence, cigarettes, uncomplicated: Secondary | ICD-10-CM | POA: Insufficient documentation

## 2015-06-11 NOTE — ED Notes (Signed)
Since here by [facility].

## 2015-06-11 NOTE — ED Notes (Signed)
Here for medical clearance.  Using THC, ETOH, and cocaine.  Last used on Friday.

## 2015-06-11 NOTE — Discharge Instructions (Signed)
Health Maintenance, Male A healthy lifestyle and preventative care can promote health and wellness.  Maintain regular health, dental, and eye exams.  Eat a healthy diet. Foods like vegetables, fruits, whole grains, low-fat dairy products, and lean protein foods contain the nutrients you need and are low in calories. Decrease your intake of foods high in solid fats, added sugars, and salt. Get information about a proper diet from your health care provider, if necessary.  Regular physical exercise is one of the most important things you can do for your health. Most adults should get at least 150 minutes of moderate-intensity exercise (any activity that increases your heart rate and causes you to sweat) each week. In addition, most adults need muscle-strengthening exercises on 2 or more days a week.   Maintain a healthy weight. The body mass index (BMI) is a screening tool to identify possible weight problems. It provides an estimate of body fat based on height and weight. Your health care provider can find your BMI and can help you achieve or maintain a healthy weight. For males 20 years and older:  A BMI below 18.5 is considered underweight.  A BMI of 18.5 to 24.9 is normal.  A BMI of 25 to 29.9 is considered overweight.  A BMI of 30 and above is considered obese.  Maintain normal blood lipids and cholesterol by exercising and minimizing your intake of saturated fat. Eat a balanced diet with plenty of fruits and vegetables. Blood tests for lipids and cholesterol should begin at age 79 and be repeated every 5 years. If your lipid or cholesterol levels are high, you are over age 83, or you are at high risk for heart disease, you may need your cholesterol levels checked more frequently.Ongoing high lipid and cholesterol levels should be treated with medicines if diet and exercise are not working.  If you smoke, find out from your health care provider how to quit. If you do not use tobacco, do not  start.  Lung cancer screening is recommended for adults aged 55-80 years who are at high risk for developing lung cancer because of a history of smoking. A yearly low-dose CT scan of the lungs is recommended for people who have at least a 30-pack-year history of smoking and are current smokers or have quit within the past 15 years. A pack year of smoking is smoking an average of 1 pack of cigarettes a day for 1 year (for example, a 30-pack-year history of smoking could mean smoking 1 pack a day for 30 years or 2 packs a day for 15 years). Yearly screening should continue until the smoker has stopped smoking for at least 15 years. Yearly screening should be stopped for people who develop a health problem that would prevent them from having lung cancer treatment.  If you choose to drink alcohol, do not have more than 2 drinks per day. One drink is considered to be 12 oz (360 mL) of beer, 5 oz (150 mL) of wine, or 1.5 oz (45 mL) of liquor.  Avoid the use of street drugs. Do not share needles with anyone. Ask for help if you need support or instructions about stopping the use of drugs.  High blood pressure causes heart disease and increases the risk of stroke. High blood pressure is more likely to develop in:  People who have blood pressure in the end of the normal range (100-139/85-89 mm Hg).  People who are overweight or obese.  People who are African American.  If you are 18-39 years of age, have your blood pressure checked every 3-5 years. If you are 40 years of age or older, have your blood pressure checked every year. You should have your blood pressure measured twice--once when you are at a hospital or clinic, and once when you are not at a hospital or clinic. Record the average of the two measurements. To check your blood pressure when you are not at a hospital or clinic, you can use:  An automated blood pressure machine at a pharmacy.  A home blood pressure monitor.  If you are 45-79 years  old, ask your health care provider if you should take aspirin to prevent heart disease.  Diabetes screening involves taking a blood sample to check your fasting blood sugar level. This should be done once every 3 years after age 45 if you are at a normal weight and without risk factors for diabetes. Testing should be considered at a younger age or be carried out more frequently if you are overweight and have at least 1 risk factor for diabetes.  Colorectal cancer can be detected and often prevented. Most routine colorectal cancer screening begins at the age of 50 and continues through age 75. However, your health care provider may recommend screening at an earlier age if you have risk factors for colon cancer. On a yearly basis, your health care provider may provide home test kits to check for hidden blood in the stool. A small camera at the end of a tube may be used to directly examine the colon (sigmoidoscopy or colonoscopy) to detect the earliest forms of colorectal cancer. Talk to your health care provider about this at age 50 when routine screening begins. A direct exam of the colon should be repeated every 5-10 years through age 75, unless early forms of precancerous polyps or small growths are found.  People who are at an increased risk for hepatitis B should be screened for this virus. You are considered at high risk for hepatitis B if:  You were born in a country where hepatitis B occurs often. Talk with your health care provider about which countries are considered high risk.  Your parents were born in a high-risk country and you have not received a shot to protect against hepatitis B (hepatitis B vaccine).  You have HIV or AIDS.  You use needles to inject street drugs.  You live with, or have sex with, someone who has hepatitis B.  You are a man who has sex with other men (MSM).  You get hemodialysis treatment.  You take certain medicines for conditions like cancer, organ  transplantation, and autoimmune conditions.  Hepatitis C blood testing is recommended for all people born from 1945 through 1965 and any individual with known risk factors for hepatitis C.  Healthy men should no longer receive prostate-specific antigen (PSA) blood tests as part of routine cancer screening. Talk to your health care provider about prostate cancer screening.  Testicular cancer screening is not recommended for adolescents or adult males who have no symptoms. Screening includes self-exam, a health care provider exam, and other screening tests. Consult with your health care provider about any symptoms you have or any concerns you have about testicular cancer.  Practice safe sex. Use condoms and avoid high-risk sexual practices to reduce the spread of sexually transmitted infections (STIs).  You should be screened for STIs, including gonorrhea and chlamydia if:  You are sexually active and are younger than 24 years.  You   are older than 24 years, and your health care provider tells you that you are at risk for this type of infection.  Your sexual activity has changed since you were last screened, and you are at an increased risk for chlamydia or gonorrhea. Ask your health care provider if you are at risk.  If you are at risk of being infected with HIV, it is recommended that you take a prescription medicine daily to prevent HIV infection. This is called pre-exposure prophylaxis (PrEP). You are considered at risk if:  You are a man who has sex with other men (MSM).  You are a heterosexual man who is sexually active with multiple partners.  You take drugs by injection.  You are sexually active with a partner who has HIV.  Talk with your health care provider about whether you are at high risk of being infected with HIV. If you choose to begin PrEP, you should first be tested for HIV. You should then be tested every 3 months for as long as you are taking PrEP.  Use sunscreen. Apply  sunscreen liberally and repeatedly throughout the day. You should seek shade when your shadow is shorter than you. Protect yourself by wearing long sleeves, pants, a wide-brimmed hat, and sunglasses year round whenever you are outdoors.  Tell your health care provider of new moles or changes in moles, especially if there is a change in shape or color. Also, tell your health care provider if a mole is larger than the size of a pencil eraser.  A one-time screening for abdominal aortic aneurysm (AAA) and surgical repair of large AAAs by ultrasound is recommended for men aged 65-75 years who are current or former smokers.  Stay current with your vaccines (immunizations).   This information is not intended to replace advice given to you by your health care provider. Make sure you discuss any questions you have with your health care provider.   Document Released: 11/07/2007 Document Revised: 06/01/2014 Document Reviewed: 10/06/2010 Elsevier Interactive Patient Education 2016 [employer].    Emergency Department Resource Guide 1) Find a Doctor and Pay Out of Pocket Although you won't have to find out who is covered by your insurance plan, it is a good idea to ask around and get recommendations. You will then need to call the office and see if the doctor you have chosen will accept you as a new patient and what types of options they offer for patients who are self-pay. Some doctors offer discounts or will set up payment plans for their patients who do not have insurance, but you will need to ask so you aren't surprised when you get to your appointment.  2) Contact Your Local Health Department Not all health departments have doctors that can see patients for sick visits, but many do, so it is worth a call to see if yours does. If you don't know where your local health department is, you can check in your phone book. The CDC also has a tool to help you locate your state's health department, and many  state websites also have listings of all of their local health departments.  3) Find a Walk-in Clinic If your illness is not likely to be very severe or complicated, you may want to try a walk in clinic. These are popping up all over the country in pharmacies, drugstores, and shopping centers. They're usually staffed by nurse practitioners or physician assistants that have been trained to treat common illnesses and complaints. They're  usually fairly quick and inexpensive. However, if you have serious medical issues or chronic medical problems, these are probably not your best option.  No Primary Care Doctor: - Call Health Connect at  [phone] - they can help you locate a primary care doctor that  accepts your insurance, provides certain services, etc. - Physician Referral Service- [phone]  Chronic Pain Problems: Organization         Address  Phone   Notes  [facility]  [phone] Patients need to be referred by their primary care doctor.   Medication Assistance: Organization         Address  Phone   Notes  [facility] Medication [facility] [address] [place] [place], [place] [address] [phone] --Must be a resident of [facility] -- Must have NO insurance coverage whatsoever (no Medicaid/ Medicare, etc.) -- The pt. MUST have a primary care doctor that directs their care regularly and follows them in the community   [facility]  [phone]   [employer]  [phone]    Agencies that provide inexpensive medical care: Organization         Address  Phone   Notes  [facility]  [phone]   [facility]    [phone]   [facility] [address] [place], [place] [address] [phone]   [facility] [address] [place] [place] [phone]   Planned Parenthood    [phone]   [facility]    [phone]   Community Health and  [facility]  [address], [place] Phone:  [phone], Fax:  [phone] Hours of Operation:  9 am - 6 pm, M-F.  Also accepts Medicaid/Medicare and self-pay.  [facility]  [address], [place] Phone: [phone], Fax: [phone]. Hours of Operation:  8:30 am - 5:30 pm, M-F.  Also accepts Medicaid and self-pay.  [facility] [place] [address] [place] Phone: [phone]   [facility] [address] [place] [address] Natasha Bence [place], [place] [phone], Ext. [phone] Mondays & Thursdays: 7-9 AM.  First 15 patients are seen on a first come, first serve basis.    Medicaid-accepting [facility] Providers:  Organization         Address  Phone   Notes  [facility] [address],  [phone] Also accepts self-pay patients.  [facility] [address] Laurell Josephs [place], [place]  [phone]   [facility] [address], [place] [phone]   [facility] [address], [place] [phone]   Renaye Rakers [address], [place]   [phone] Only accepts [place] Access [place] patients after they have their name applied to their card.   Self-Pay (no insurance) in [facility]:  Organization         Address  Phone   Notes  Sickle Cell Patients, [facility] [address] [place], [place] [phone]   [facility] [address] [place], [place] [phone]   [facility] [place]  [address] [place] [address], [place] [phone]   [facility]/Dr. Osei-Bonsu  [address] [place] or [address], [place] [phone] Phone  number for both [place] and [place] locations is the same.  Urgent Medical and [facility] [address], [place] [phone]   [facility] [address] [place] or [address] [phone]   [facility] [address], [place] [phone], phone; [phone], fax Sees patients 1st and 3rd Saturday of every month.  Must not qualify for public or private insurance (i.e. Medicaid, Medicare, [place] Health Choice, Veterans' Benefits)  Household income should be no more than 200% of the poverty level The clinic cannot treat you if you are pregnant or think you are pregnant  Sexually transmitted diseases are not treated at the clinic.    Dental Care: Organization         Address  Phone  Notes  [facility] Department of [facility] [address] [place], [place] [phone] Accepts children up to age 72 who are enrolled in [place] or [place] Health Choice; pregnant women with a Medicaid card; and children who have applied for Medicaid or [place] Health Choice, but were declined, whose parents can pay a reduced fee at time of service.  [facility] Department of [facility]  [address], [place] [phone] Accepts children up to age 46 who are enrolled in [place] or [place] Health Choice; pregnant women with a Medicaid card; and children who have applied for Medicaid or [facility] Health Choice, but were declined, whose parents can pay a reduced fee at time of service.  [facility]  [address] [place], [place] [phone] Patients are seen by appointment only. Walk-ins are not accepted. [facility] will see patients 26 years of age and older. Monday - Tuesday (8am-5pm) Most Wednesdays (8:30-5pm) $30 per visit, cash only  [facility] Adult Dental Access PROGRAM  [address], [facility] [phone] Patients are seen by appointment only. Walk-ins are not accepted. [facility] will see patients 60 years of age and older. One Wednesday Evening (Monthly: Volunteer Based).  $30 per visit, cash only  [employer]  [phone] for adults; Children under age 51, call Graduate Pediatric Dentistry at [phone]. Children aged 60-14, please call [phone] to request a pediatric application.  Dental services are provided in all areas of dental care including fillings, crowns and bridges, complete and partial dentures, implants, gum treatment, root canals, and extractions. Preventive care is also provided. Treatment is provided to both adults and children. Patients are selected via a lottery and there is often a waiting list.   [facility] [address], [place]  [phone] [email or web address]   [facility] [address] [place], [place] [phone], Ext. [phone] Second and Fourth Thursday of each month, opens at 6:30 AM; Clinic ends at 9 AM.  Patients are seen on a first-come first-served basis, and a limited number are seen during each clinic.   [facility]  [address] Ether [place], [place] [phone]   Eligibility Requirements You must have lived in [place], [place], or [place] counties for at least the last three months.   You cannot be eligible for state or federal sponsored [place], including [employer], [place], or [employer].   You generally cannot be eligible for healthcare insurance through your employer.    How to apply: Eligibility screenings are held every Tuesday and Wednesday afternoon from 1:00 pm  until 4:00 pm. You do not need an appointment for the interview!  [facility] [address] [place], [place] [phone]   [facility] Health Department  [phone]   [facility] Health Department  [phone]   [facility]  [phone]    Behavioral Health Resources in the Community: Intensive Outpatient Programs Organization         Address  Phone  Notes  [facility] [address], [place], [place]  [phone]   [facility] Outpatient [address], [place], [place] [phone]   [facility] [address], [place], [place]  [phone]   [facility] [address]  [place], [place] [phone] or [phone]   Substance Abuse Resources Organization         Address  Phone  Notes  [facility]  [phone]   [facility]  [phone]   [place]  [phone]   Floydene Flock  [phone]   Residential & Outpatient Substance Abuse Program  [phone]   Psychological Services Organization         Address  Phone  Notes  [facility]  [phone]   [facility] Services  [phone]   [facility] Mental Health [address] [place] [phone] or [phone]    Mobile Crisis Teams Organization         Address  Phone  Notes  [facility], Mobile Crisis Care Unit  [phone]   [facility]  [address] [place], [place] [phone]   Doristine Locks [address] [place] [phone]    Self-Help/Support Groups Organization         Address  Phone             Notes  [facility] [place] - variety of support groups  336- I7437963 Call for more information  Narcotics Anonymous (NA), [facility] [address], [employer] [place]  2 meetings at this location   Statistician         Address  Phone  Notes  [facility] [address],    [place]  [phone]   [facility]  [address], [place] [address], [place], [place] [phone]   [facility] [address] [place], [place] [phone] Admissions: 8am-3pm M-F  [facility] [address]    [place], [place] [phone]   [facility] [address] [place], [place], [place] [phone]   [facility] [address]    [place], [place] [phone]   [facility] - Intensive Outpatient [address] Laurell Josephs [address], [place], [place] [phone]   [facility] (Addiction [facility] [address] [place].,  [place], [place] [phone] or [phone]   [facility] ([facility]) [address] [place], [place] [phone] Accepts Medicaid  [facility] [place] [address],  [place] [phone] Substance Abuse/Addiction Treatment   [facility] Organization         Address  Phone  Notes  [facility]  [phone]   Angie Fava, PhD [address] Ervin Knack [place], [place]   [phone] or [phone]   [facility] Behavioral   [address] [place], [place] [phone]   [facility] [address] [place], [place] [phone] Insurance/Medicaid/sponsorship through [employer] and Families [address] [place]  [address]                                    [place], [place] [phone] Therapy/tele-psych/case  [facility] [address]   [place], [place] [phone]    Dr. Lolly Mustache  [phone]   [facility]  [employer] [facility] Dept. 1) [address] [place] 2) [address] [place] 3)  [address], [place] [phone]  [phone]   [facility] Child Abuse Hotline [phone] or [phone] (After Hours)

## 2015-06-11 NOTE — ED Provider Notes (Signed)
CSN: [id number]     Arrival date & time 06/11/15  1353 History   First MD Initiated Contact with Patient 06/11/15 1438     Chief Complaint  Patient presents with  . Medical Clearance     (Consider location/radiation/quality/duration/timing/severity/associated sxs/prior Treatment) HPI Pt here for medical clearance.  Pt is going to [facility] in [place]. Pt has a history of alcohol, cocaine and marijuana abuse.  Pt is healthy.  No heart disease. No diabetes or hypertension History reviewed. No pertinent past medical history. Past Surgical History  Procedure Laterality Date  . Tonsillectomy     History reviewed. No pertinent family history. Social History  Substance Use Topics  . Smoking status: Current Every Day Smoker    Types: Cigarettes  . Smokeless tobacco: None  . Alcohol Use: Yes     Comment: occ    Review of Systems  All other systems reviewed and are negative.     Allergies  Review of patient's allergies indicates no known allergies.  Home Medications   Prior to Admission medications   Not on File   BP 139/83 mmHg  Pulse 58  Temp(Src) 98.1 F (36.7 C) (Oral)  Resp 18  Ht  (1.803 m)  Wt 72.576 kg  BMI 22.33 kg/m2  SpO2 100% Physical Exam  Constitutional: He is oriented to person, place, and time. He appears well-developed and well-nourished.  HENT:  Head: Normocephalic and atraumatic.  Right Ear: External ear normal.  Mouth/Throat: Oropharynx is clear and moist.  Eyes: Conjunctivae and EOM are normal. Pupils are equal, round, and reactive to light.  Neck: Normal range of motion. Neck supple.  Cardiovascular: Normal rate and normal heart sounds.   Pulmonary/Chest: Effort normal and breath sounds normal.  Abdominal: Soft.  Musculoskeletal: Normal range of motion.  Neurological: He is alert and oriented to person, place, and time. He has normal reflexes.  Skin: Skin is warm.  Psychiatric: He has a normal mood and affect.  Nursing note and vitals  reviewed.   ED Course  Procedures (including critical care time) Labs Review Labs Reviewed - No data to display  Imaging Review No results found. I have personally reviewed and evaluated these images and lab results as part of my medical decision-making.   EKG Interpretation None      MDM   Final diagnoses:  Medical clearance for psychiatric admission        Elson Areas, Cordelia Poche 06/11/15 1510  Raeford Razor, MD 06/17/15 [phone]

## 2015-11-01 ENCOUNTER — Emergency Department (HOSPITAL_COMMUNITY)
Admission: EM | Admit: 2015-11-01 | Discharge: 2015-11-01 | Disposition: A | Discharge status: Home / Self Care | Payer: Self-pay | Attending: Emergency Medicine | Admitting: Emergency Medicine

## 2015-11-01 ENCOUNTER — Encounter (HOSPITAL_COMMUNITY): Payer: Self-pay | Admitting: Cardiology

## 2015-11-01 DIAGNOSIS — F17210 Nicotine dependence, cigarettes, uncomplicated: Secondary | ICD-10-CM | POA: Insufficient documentation

## 2015-11-01 DIAGNOSIS — R197 Diarrhea, unspecified: Secondary | ICD-10-CM | POA: Insufficient documentation

## 2015-11-01 LAB — CBC WITH DIFFERENTIAL/PLATELET
BASOS ABS: 0 10*3/uL (ref 0.0–0.1)
BASOS PCT: 1 %
EOS ABS: 0.1 10*3/uL (ref 0.0–0.7)
Eosinophils Relative: 1 %
HCT: 45.6 % (ref 39.0–52.0)
HEMOGLOBIN: 16 g/dL (ref 13.0–17.0)
Lymphocytes Relative: 39 %
Lymphs Abs: 2.3 10*3/uL (ref 0.7–4.0)
MCH: 31.7 pg (ref 26.0–34.0)
MCHC: 35.1 g/dL (ref 30.0–36.0)
MCV: 90.3 fL (ref 78.0–100.0)
Monocytes Absolute: 0.4 10*3/uL (ref 0.1–1.0)
Monocytes Relative: 7 %
NEUTROS PCT: 52 %
Neutro Abs: 3.1 10*3/uL (ref 1.7–7.7)
Platelets: 197 10*3/uL (ref 150–400)
RBC: 5.05 MIL/uL (ref 4.22–5.81)
RDW: 12.6 % (ref 11.5–15.5)
WBC: 6 10*3/uL (ref 4.0–10.5)

## 2015-11-01 LAB — COMPREHENSIVE METABOLIC PANEL
ALBUMIN: 4.3 g/dL (ref 3.5–5.0)
ALK PHOS: 62 U/L (ref 38–126)
ALT: 17 U/L (ref 17–63)
ANION GAP: 5 (ref 5–15)
AST: 21 U/L (ref 15–41)
BUN: 19 mg/dL (ref 6–20)
CALCIUM: 9.1 mg/dL (ref 8.9–10.3)
CO2: 29 mmol/L (ref 22–32)
Chloride: 103 mmol/L (ref 101–111)
Creatinine, Ser: 1.3 mg/dL — ABNORMAL HIGH (ref 0.61–1.24)
GFR calc Af Amer: >60 mL/min (ref >60)
GFR calc non Af Amer: >60 mL/min (ref >60)
GLUCOSE: 84 mg/dL (ref 65–99)
Potassium: 3.6 mmol/L (ref 3.5–5.1)
SODIUM: 137 mmol/L (ref 135–145)
Total Bilirubin: 1.2 mg/dL (ref 0.3–1.2)
Total Protein: 7.7 g/dL (ref 6.5–8.1)

## 2015-11-01 LAB — LIPASE, BLOOD: Lipase: 20 U/L (ref 11–51)

## 2015-11-01 MED ORDER — SODIUM CHLORIDE 0.9 % IV BOLUS (SEPSIS): 1000.0000 mL | Freq: Once | INTRAVENOUS | Status: DC

## 2015-11-01 MED ORDER — SODIUM CHLORIDE 0.9 % IV BOLUS (SEPSIS)
1000.0000 mL | Freq: Once | INTRAVENOUS | Status: AC
  Administered 2015-11-01: 1000 mL via INTRAVENOUS

## 2015-11-01 MED ORDER — MORPHINE SULFATE (PF) 4 MG/ML IV SOLN
4.0000 mg | Freq: Once | INTRAVENOUS | Status: DC
  Filled 2015-11-01: qty 1

## 2015-11-01 MED ORDER — ONDANSETRON HCL 4 MG/2ML IJ SOLN
4.0000 mg | Freq: Once | INTRAMUSCULAR | Status: DC
Start: 2015-11-01 — End: 2015-11-02
  Filled 2015-11-01: qty 2

## 2015-11-01 MED ORDER — PROMETHAZINE HCL 25 MG PO TABS: 25.0000 mg | ORAL_TABLET | Freq: Four times a day (QID) | ORAL | Status: DC | PRN

## 2015-11-01 NOTE — Discharge Instructions (Signed)
Medication for nausea. Increase fluids. Your tests for kidney function was slightly elevated (creatinine 1.3).   This should be rechecked at some time.

## 2015-11-01 NOTE — ED Notes (Signed)
IV started, IVF started. Patient stated he is currently not having any pain or nausea and at this time does not want the Zofran or Morphine which is ordered. Patient told to inform myself if he develops symptoms or changes his mind.

## 2015-11-01 NOTE — ED Notes (Signed)
Sorethroat,  Achy all over and diarrhea since Monday

## 2015-11-01 NOTE — ED Provider Notes (Signed)
CSN: [id number]     Arrival date & time 11/01/15  1737 History   First MD Initiated Contact with Patient 11/01/15 1756     Chief Complaint  Patient presents with  . Diarrhea     (Consider location/radiation/quality/duration/timing/severity/associated sxs/prior Treatment) HPI...Marland Kitchen.Marland Kitchen.Multiple episodes of diarrhea since Monday described as watery and brown. No blood or mucus in stool. Patient also complains of generalized abdominal tenderness.  Other family members have been sick recently with similar symptoms. He is normally healthy. No medications or chronic illnesses. Severity is mild to moderate. Nothing makes symptoms better or worse.  History reviewed. No pertinent past medical history. Past Surgical History  Procedure Laterality Date  . Tonsillectomy     History reviewed. No pertinent family history. Social History  Substance Use Topics  . Smoking status: Current Every Day Smoker    Types: Cigarettes  . Smokeless tobacco: None  . Alcohol Use: Yes     Comment: occ    Review of Systems  All other systems reviewed and are negative.     Allergies  Review of patient's allergies indicates no known allergies.  Home Medications   Prior to Admission medications   Medication Sig Start Date End Date Taking? Authorizing Provider  promethazine (PHENERGAN) 25 MG tablet Take 1 tablet (25 mg total) by mouth every 6 (six) hours as needed. 11/01/15   Donnetta HutchingBrian Lamesha Tibbits, MD   BP 123/73 mmHg  Pulse 75  Temp(Src) 98.1 F (36.7 C) (Oral)  Resp 18  Ht 5\' 9"  (1.753 m)  Wt 146 lb (66.225 kg)  BMI 21.55 kg/m2  SpO2 99% Physical Exam  Constitutional: He is oriented to person, place, and time. He appears well-developed and well-nourished.  HENT:  Head: Normocephalic and atraumatic.  Eyes: Conjunctivae and EOM are normal. Pupils are equal, round, and reactive to light.  Neck: Normal range of motion. Neck supple.  Cardiovascular: Normal rate and regular rhythm.   Pulmonary/Chest: Effort normal and  breath sounds normal.  Abdominal:  Minimal generalized tenderness  Musculoskeletal: Normal range of motion.  Neurological: He is alert and oriented to person, place, and time.  Skin: Skin is warm and dry.  Psychiatric: He has a normal mood and affect. His behavior is normal.  Nursing note and vitals reviewed.   ED Course  Procedures (including critical care time) Labs Review Labs Reviewed  COMPREHENSIVE METABOLIC PANEL - Abnormal; Notable for the following:    Creatinine, Ser 1.30 (*)    All other components within normal limits  CBC WITH DIFFERENTIAL/PLATELET  LIPASE, BLOOD    Imaging Review No results found. I have personally reviewed and evaluated these images and lab results as part of my medical decision-making.   EKG Interpretation None      MDM   Final diagnoses:  Diarrhea, unspecified type    Patient feels much better after IV fluids, IV Zofran, IV morphine. No acute abdomen. Labs stable with the exception of a creatinine of 1.3. This was discussed with the patient. Discharge medication Phenergan 25 mg.    Donnetta HutchingBrian Sopheap Basic, MD 11/01/15 2044

## 2016-12-31 ENCOUNTER — Encounter (HOSPITAL_COMMUNITY): Payer: Self-pay

## 2016-12-31 ENCOUNTER — Emergency Department (HOSPITAL_COMMUNITY)
Admission: EM | Admit: 2016-12-31 | Discharge: 2016-12-31 | Disposition: A | Discharge status: Home / Self Care | Payer: Self-pay | Attending: Emergency Medicine | Admitting: Emergency Medicine

## 2016-12-31 DIAGNOSIS — F17210 Nicotine dependence, cigarettes, uncomplicated: Secondary | ICD-10-CM | POA: Insufficient documentation

## 2016-12-31 DIAGNOSIS — K611 Rectal abscess: Secondary | ICD-10-CM | POA: Insufficient documentation

## 2016-12-31 MED ORDER — SULFAMETHOXAZOLE-TRIMETHOPRIM 800-160 MG PO TABS: 1.0000 | ORAL_TABLET | Freq: Two times a day (BID) | ORAL | 0 refills | Status: AC

## 2016-12-31 NOTE — ED Provider Notes (Signed)
[facility]-EMERGENCY DEPT Provider Note   CSN: [id number] Arrival date & time: 12/31/16  1549     History   Chief Complaint Chief Complaint  Patient presents with  . Abscess    HPI Aaron Francis is a 26 y.o. male presenting an intermittent abscess at his rectum which has been present for several months. He endorses swelling and pain which is followed by drainage, usually improved with warm water soaks but the site continues to return.  He denies increased swelling at this time but does state the site oozes a small amount of pus persistently.  He has not had an other treatments for this condition and denies any other areas of abscess or infection. He denies diarrhea, constipation, blood in stools.  HPI  History reviewed. No pertinent past medical history.  There are no active problems to display for this patient.   Past Surgical History:  Procedure Laterality Date  . TONSILLECTOMY         Home Medications    Prior to Admission medications   Medication Sig Start Date End Date Taking? Authorizing Provider  promethazine (PHENERGAN) 25 MG tablet Take 1 tablet (25 mg total) by mouth every 6 (six) hours as needed. 11/01/15   Donnetta Hutchingook, Brian, MD  sulfamethoxazole-trimethoprim (BACTRIM DS,SEPTRA DS) 800-160 MG tablet Take 1 tablet by mouth 2 (two) times daily. 12/31/16 01/07/17  Burgess AmorIdol, Jahnya Trindade, PA-C    Family History No family history on file.  Social History Social History  Substance Use Topics  . Smoking status: Current Every Day Smoker    Types: Cigarettes  . Smokeless tobacco: Never Used  . Alcohol use Yes     Comment: occ     Allergies   Patient has no known allergies.   Review of Systems Review of Systems  Constitutional: Negative for chills and fever.  HENT: Negative.   Eyes: Negative.   Respiratory: Negative.   Cardiovascular: Negative.   Gastrointestinal: Positive for rectal pain. Negative for abdominal pain, constipation, diarrhea and nausea.  Genitourinary:  Negative.   Musculoskeletal: Negative for arthralgias, joint swelling and neck pain.  Skin: Negative.  Negative for rash and wound.  Neurological: Negative for dizziness, weakness, light-headedness, numbness and headaches.  Psychiatric/Behavioral: Negative.      Physical Exam Updated Vital Signs BP 137/81 (BP Location: Right Arm)   Temp 98.8 F (37.1 C) (Oral)   Resp 19   Ht 5\' 11"  (1.803 m)   Wt 71.2 kg (157 lb)   SpO2 99%   BMI 21.90 kg/m   Physical Exam  Constitutional: He appears well-developed and well-nourished.  HENT:  Head: Normocephalic and atraumatic.  Eyes: Conjunctivae are normal.  Neck: Normal range of motion.  Cardiovascular: Normal rate, regular rhythm, normal heart sounds and intact distal pulses.   Pulmonary/Chest: Effort normal and breath sounds normal. He has no wheezes.  Abdominal: Soft. Bowel sounds are normal. There is no tenderness.  Genitourinary:  Genitourinary Comments: There is a small tract or enlarged pore right perianal area approx 7 oclock position which drains a tiny amount of purulent dc with application of pressure.  No raised erythema, edema, no induration or fluctuance of site.  Small area of surrounding scarring suggesting chronic condition.  Musculoskeletal: Normal range of motion.  Neurological: He is alert.  Skin: Skin is warm and dry.  Psychiatric: He has a normal mood and affect.  Nursing note and vitals reviewed.    ED Treatments / Results  Labs (all labs ordered are listed, but only abnormal  results are displayed) Labs Reviewed - No data to display  EKG  EKG Interpretation None       Radiology No results found.  Procedures Procedures (including critical care time)  Medications Ordered in ED Medications - No data to display   Initial Impression / Assessment and Plan / ED Course  I have reviewed the triage vital signs and the nursing notes.  Pertinent labs & imaging results that were available during my care of  the patient were reviewed by me and considered in my medical decision making (see chart for details).     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 Given chronic nature, pt would best be served by surgical excision or I suspect this will continue to become infected.  He was encouraged warm soaks, bactrim prescribed, referral given to general surgery.  Final Clinical Impressions(s) / ED Diagnoses   Final diagnoses:  Peri-rectal abscess    New Prescriptions Discharge Medication List as of 12/31/2016  4:53 PM    START taking these medications   Details  sulfamethoxazole-trimethoprim (BACTRIM DS,SEPTRA DS) 800-160 MG tablet Take 1 tablet by mouth 2 (two) times daily., Starting Thu 12/31/2016, Until Thu 01/07/2017, Print         Burgess Amor, PA-C 12/31/16 1929    Mancel Bale, MD 12/31/16 [phone]

## 2016-12-31 NOTE — ED Triage Notes (Signed)
Pt reports he has what he thinks is a boil around rectum for the past several months.  Pt says at times it will bleed.  Pt says pain comes and goes.

## 2016-12-31 NOTE — Discharge Instructions (Signed)
As discussed,  warm epsom salt soaks may help keep this open and draining so it doesn't fill up and get swollen again. You may need to have this surgically removed as discussed or this may continue to return.  See the referral numbers given for this.

## 2017-01-07 ENCOUNTER — Emergency Department (HOSPITAL_COMMUNITY)
Admission: EM | Admit: 2017-01-07 | Discharge: 2017-01-07 | Disposition: A | Discharge status: Home / Self Care | Payer: Self-pay | Attending: Emergency Medicine | Admitting: Emergency Medicine

## 2017-01-07 ENCOUNTER — Encounter (HOSPITAL_COMMUNITY): Payer: Self-pay | Admitting: Emergency Medicine

## 2017-01-07 DIAGNOSIS — W57XXXA Bitten or stung by nonvenomous insect and other nonvenomous arthropods, initial encounter: Secondary | ICD-10-CM | POA: Insufficient documentation

## 2017-01-07 DIAGNOSIS — Z5321 Procedure and treatment not carried out due to patient leaving prior to being seen by health care provider: Secondary | ICD-10-CM | POA: Insufficient documentation

## 2017-01-07 DIAGNOSIS — Y999 Unspecified external cause status: Secondary | ICD-10-CM | POA: Insufficient documentation

## 2017-01-07 DIAGNOSIS — S50861A Insect bite (nonvenomous) of right forearm, initial encounter: Secondary | ICD-10-CM | POA: Insufficient documentation

## 2017-01-07 DIAGNOSIS — Y929 Unspecified place or not applicable: Secondary | ICD-10-CM | POA: Insufficient documentation

## 2017-01-07 DIAGNOSIS — Y939 Activity, unspecified: Secondary | ICD-10-CM | POA: Insufficient documentation

## 2017-01-07 NOTE — ED Notes (Signed)
Pt not in room when this nurse returned to room.

## 2017-01-07 NOTE — ED Notes (Signed)
Pt did not return to room. Pt did not tell this nurse he was leaving.

## 2017-01-07 NOTE — ED Triage Notes (Signed)
Pt c/o spider bite to the right posterior forearm x 1 day.

## 2017-01-07 NOTE — ED Notes (Signed)
Pt states area on his right forearm he knocked a spider off yesterday. Noted area of redness & warm to the touch

## 2018-03-27 ENCOUNTER — Encounter (HOSPITAL_COMMUNITY): Payer: Self-pay | Admitting: Emergency Medicine

## 2018-03-27 ENCOUNTER — Emergency Department (HOSPITAL_COMMUNITY): Payer: Self-pay

## 2018-03-27 ENCOUNTER — Other Ambulatory Visit: Payer: Self-pay

## 2018-03-27 ENCOUNTER — Emergency Department (HOSPITAL_COMMUNITY)
Admission: EM | Admit: 2018-03-27 | Discharge: 2018-03-27 | Disposition: A | Discharge status: Home / Self Care | Payer: Self-pay | Attending: Emergency Medicine | Admitting: Emergency Medicine

## 2018-03-27 DIAGNOSIS — F17210 Nicotine dependence, cigarettes, uncomplicated: Secondary | ICD-10-CM | POA: Insufficient documentation

## 2018-03-27 DIAGNOSIS — R319 Hematuria, unspecified: Secondary | ICD-10-CM

## 2018-03-27 DIAGNOSIS — N390 Urinary tract infection, site not specified: Secondary | ICD-10-CM | POA: Insufficient documentation

## 2018-03-27 DIAGNOSIS — N50819 Testicular pain, unspecified: Secondary | ICD-10-CM

## 2018-03-27 LAB — URINALYSIS, ROUTINE W REFLEX MICROSCOPIC
Bacteria, UA: NONE SEEN
Bilirubin Urine: NEGATIVE
Glucose, UA: NEGATIVE mg/dL
Ketones, ur: NEGATIVE mg/dL
Nitrite: NEGATIVE
PROTEIN: 30 mg/dL — AB
RBC / HPF: >50 RBC/hpf — ABNORMAL HIGH (ref 0–5)
Specific Gravity, Urine: 1.02 (ref 1.005–1.030)
pH: 7 (ref 5.0–8.0)

## 2018-03-27 LAB — COMPREHENSIVE METABOLIC PANEL
ALBUMIN: 3.9 g/dL (ref 3.5–5.0)
ALT: 12 U/L (ref 0–44)
AST: 16 U/L (ref 15–41)
Alkaline Phosphatase: 74 U/L (ref 38–126)
Anion gap: 8 (ref 5–15)
BUN: 6 mg/dL (ref 6–20)
CHLORIDE: 105 mmol/L (ref 98–111)
CO2: 27 mmol/L (ref 22–32)
Calcium: 8.7 mg/dL — ABNORMAL LOW (ref 8.9–10.3)
Creatinine, Ser: 1.08 mg/dL (ref 0.61–1.24)
GFR calc Af Amer: >60 mL/min (ref >60)
GFR calc non Af Amer: >60 mL/min (ref >60)
GLUCOSE: 89 mg/dL (ref 70–99)
Potassium: 3 mmol/L — ABNORMAL LOW (ref 3.5–5.1)
Sodium: 140 mmol/L (ref 135–145)
Total Bilirubin: 0.7 mg/dL (ref 0.3–1.2)
Total Protein: 7.9 g/dL (ref 6.5–8.1)

## 2018-03-27 LAB — CBC WITH DIFFERENTIAL/PLATELET
Abs Immature Granulocytes: 0.06 10*3/uL (ref 0.00–0.07)
BASOS ABS: 0.1 10*3/uL (ref 0.0–0.1)
BASOS PCT: 0 %
EOS ABS: 0.3 10*3/uL (ref 0.0–0.5)
Eosinophils Relative: 2 %
HCT: 45.8 % (ref 39.0–52.0)
Hemoglobin: 15.3 g/dL (ref 13.0–17.0)
Immature Granulocytes: 0 %
LYMPHS ABS: 1.6 10*3/uL (ref 0.7–4.0)
Lymphocytes Relative: 10 %
MCH: 31 pg (ref 26.0–34.0)
MCHC: 33.4 g/dL (ref 30.0–36.0)
MCV: 92.7 fL (ref 80.0–100.0)
Monocytes Absolute: 1.6 10*3/uL — ABNORMAL HIGH (ref 0.1–1.0)
Monocytes Relative: 10 %
NEUTROS PCT: 78 %
NRBC: 0 % (ref 0.0–0.2)
Neutro Abs: 12 10*3/uL — ABNORMAL HIGH (ref 1.7–7.7)
PLATELETS: 278 10*3/uL (ref 150–400)
RBC: 4.94 MIL/uL (ref 4.22–5.81)
RDW: 12 % (ref 11.5–15.5)
WBC: 15.6 10*3/uL — ABNORMAL HIGH (ref 4.0–10.5)

## 2018-03-27 MED ORDER — MORPHINE SULFATE (PF) 4 MG/ML IV SOLN
4.0000 mg | Freq: Once | INTRAVENOUS | Status: AC
  Administered 2018-03-27: 4 mg via INTRAVENOUS
  Filled 2018-03-27: qty 1

## 2018-03-27 MED ORDER — IOPAMIDOL (ISOVUE-300) INJECTION 61%
100.0000 mL | Freq: Once | INTRAVENOUS | Status: AC | PRN
  Administered 2018-03-27: 100 mL via INTRAVENOUS

## 2018-03-27 MED ORDER — DOXYCYCLINE HYCLATE 100 MG PO CAPS: 100.0000 mg | ORAL_CAPSULE | Freq: Two times a day (BID) | ORAL | 0 refills | Status: DC

## 2018-03-27 MED ORDER — HYDROCODONE-ACETAMINOPHEN 5-325 MG PO TABS: 1.0000 | ORAL_TABLET | Freq: Four times a day (QID) | ORAL | 0 refills | Status: DC | PRN

## 2018-03-27 MED ORDER — SODIUM CHLORIDE 0.9 % IV SOLN
1.0000 g | Freq: Once | INTRAVENOUS | Status: AC
  Administered 2018-03-27: 1 g via INTRAVENOUS
  Filled 2018-03-27: qty 10

## 2018-03-27 NOTE — ED Provider Notes (Signed)
[facility] EMERGENCY DEPARTMENT Provider Note   CSN: [id number] Arrival date & time: 03/27/18  1629     History   Chief Complaint Chief Complaint  Patient presents with  . Abdominal Pain    HPI Aaron Francis is a 27 y.o. male.  HPI Patient presents with right lower quadrant pain that radiates into the groin.  Started this morning.  States the pain is been constant though it does increase in severity causing him to double over.  He notes noted some intermittent blood in his urine for the past week.  States he has had some burning with urination.  No known trauma.  Denies any flank pain.  No nausea or vomiting.  No fever or chills. History reviewed. No pertinent past medical history.  There are no active problems to display for this patient.   Past Surgical History:  Procedure Laterality Date  . TONSILLECTOMY          Home Medications    Prior to Admission medications   Medication Sig Start Date End Date Taking? Authorizing Provider  doxycycline (VIBRAMYCIN) 100 MG capsule Take 1 capsule (100 mg total) by mouth 2 (two) times daily. One po bid x 7 days 03/27/18   Loren Racer, MD  HYDROcodone-acetaminophen (NORCO/VICODIN) 5-325 MG tablet Take 1 tablet by mouth every 6 (six) hours as needed for severe pain. 03/27/18   Loren Racer, MD    Family History No family history on file.  Social History Social History   Tobacco Use  . Smoking status: Current Every Day Smoker    Packs/day: 0.50    Types: Cigarettes  . Smokeless tobacco: Never Used  Substance Use Topics  . Alcohol use: Not Currently    Comment: occ  . Drug use: No     Allergies   Patient has no known allergies.   Review of Systems Review of Systems  Constitutional: Negative for chills and fever.  Gastrointestinal: Positive for abdominal pain. Negative for constipation, diarrhea, nausea and vomiting.  Genitourinary: Positive for dysuria, hematuria and testicular pain. Negative for  discharge, flank pain, frequency and penile pain.  Musculoskeletal: Negative for back pain and myalgias.  Skin: Negative for rash and wound.  Neurological: Negative for dizziness, weakness, light-headedness and numbness.  All other systems reviewed and are negative.    Physical Exam Updated Vital Signs BP (!) 141/94   Pulse 83   Ht 5\' 11"  (1.803 m)   Wt 71.2 kg   SpO2 98%   BMI 21.90 kg/m   Physical Exam  Constitutional: He is oriented to person, place, and time. He appears well-developed and well-nourished. No distress.  HENT:  Head: Normocephalic and atraumatic.  Mouth/Throat: Oropharynx is clear and moist. No oropharyngeal exudate.  Eyes: Pupils are equal, round, and reactive to light. EOM are normal.  Neck: Normal range of motion. Neck supple.  Cardiovascular: Normal rate and regular rhythm. Exam reveals no gallop and no friction rub.  No murmur heard. Pulmonary/Chest: Effort normal and breath sounds normal. No stridor. No respiratory distress. He has no wheezes. He has no rales. He exhibits no tenderness.  Abdominal: Soft. Bowel sounds are normal. There is no tenderness. There is no rebound and no guarding.  Genitourinary:  Genitourinary Comments: Patient has right inguinal tenderness to palpation without obvious inguinal hernia.  Right testicle is tender to palpation.  Unable to elicit cremasteric reflex.  No obvious swelling or deformity.  No penile discharge.  Musculoskeletal: Normal range of motion. He exhibits no edema  or tenderness.  No midline thoracic or lumbar tenderness.  No CVA tenderness.  Neurological: He is alert and oriented to person, place, and time.  Skin: Skin is warm and dry. Capillary refill takes less than 2 seconds. No rash noted. He is not diaphoretic. No erythema.  Psychiatric: He has a normal mood and affect. His behavior is normal.  Nursing note and vitals reviewed.    ED Treatments / Results  Labs (all labs ordered are listed, but only  abnormal results are displayed) Labs Reviewed  CBC WITH DIFFERENTIAL/PLATELET - Abnormal; Notable for the following components:      Result Value   WBC 15.6 (*)    Neutro Abs 12.0 (*)    Monocytes Absolute 1.6 (*)    All other components within normal limits  COMPREHENSIVE METABOLIC PANEL - Abnormal; Notable for the following components:   Potassium 3.0 (*)    Calcium 8.7 (*)    All other components within normal limits  URINALYSIS, ROUTINE W REFLEX MICROSCOPIC - Abnormal; Notable for the following components:   APPearance CLOUDY (*)    Hgb urine dipstick MODERATE (*)    Protein, ur 30 (*)    Leukocytes, UA LARGE (*)    RBC / HPF >50 (*)    WBC, UA >50 (*)    All other components within normal limits  URINE CULTURE  GC/CHLAMYDIA PROBE AMP ([place]) NOT AT [facility]    EKG None  Radiology US Scrotum  Result Date: 03/27/2018 CLINICAL DATA:  Right testicle pain and swelling. EXAM: SCROTAL ULTRASOUND DOPPLER ULTRASOUND OF THE TESTICLES TECHNIQUE: Complete ultrasound examination of the testicles, epididymis, and other scrotal structures was performed. Color and spectral Doppler ultrasound were also utilized to evaluate blood flow to the testicles. COMPARISON:  CT abdomen pelvis dated July 10, 2011. FINDINGS: Right testicle Measurements: 5.1 x 1.9 x 3.1 cm. No mass or microlithiasis visualized. Left testicle Measurements: 5.0 x 2.1 x 3.2 cm. No mass or microlithiasis visualized. Right epididymis:  Normal in size and appearance. Left epididymis: Normal in size and appearance. 4 mm epididymal head cyst. Hydrocele:  Trace bilateral hydroceles. Varicocele:  None visualized. Pulsed Doppler interrogation of both testes demonstrates normal low resistance arterial and venous waveforms bilaterally. IMPRESSION: 1. Trace bilateral hydroceles. Otherwise unremarkable testicular ultrasound. Electronically Signed   By: Obie Dredge M.D.   On: 03/27/2018 18:08   Ct Abdomen Pelvis W Contrast  Result  Date: 03/27/2018 CLINICAL DATA:  Right lower quadrant pain, radiating to right groin. Burning with urination. Blood in urine for 1 week. EXAM: CT ABDOMEN AND PELVIS WITH CONTRAST TECHNIQUE: Multidetector CT imaging of the abdomen and pelvis was performed using the standard protocol following bolus administration of intravenous contrast. CONTRAST:  ISOVUE-300 IOPAMIDOL (ISOVUE-300) INJECTION 61% COMPARISON:  CT scan July 10, 2011 FINDINGS: Lower chest: No acute abnormality. Hepatobiliary: No focal liver abnormality is seen. No gallstones, gallbladder wall thickening, or biliary dilatation. Pancreas: Unremarkable. No pancreatic ductal dilatation or surrounding inflammatory changes. Spleen: Normal in size without focal abnormality. Adrenals/Urinary Tract: The adrenal glands are normal. No renal masses, stones, hydronephrosis, or perinephric stranding. No ureterectasis. No definitive ureteral stones. There are phleboliths in the pelvis. There is a calcification in the left pelvis on series 2, image 69. I suspect this is a phlebolith rather than a stone in the ureter. The bladder is poorly distended but appears thick walled with some mild increased attenuation in the adjacent fat. No other abnormalities. Stomach/Bowel: The stomach and small bowel are  normal. The colon is unremarkable. Visualized portions of the appendix, best seen on the coronal view, are normal. No evidence of appendicitis. Vascular/Lymphatic: No significant vascular findings are present. No enlarged abdominal or pelvic lymph nodes. Reproductive: The prostate is unremarkable in appearance. There are prominent pelvic vessels bilaterally. There is a right-sided hydrocele. There may be a right-sided varicocele as well. Other: There is free fluid in the pelvis.  No free air. Musculoskeletal: No acute or significant osseous findings. IMPRESSION: 1. No renal stones or obstruction. No definitive ureteral stones. A calcification in the left pelvis is  favored to represent a phlebolith. 2. The bladder is thick walled with some mild adjacent fat stranding. This raises the possibility of cystitis. Recommend clinical correlation and correlation with urinalysis. 3. Prominent pelvic vasculature of uncertain etiology or significance. 4. Small right-sided hydrocele.  Possible right-sided varicocele. 5. Free fluid in the pelvis. Electronically Signed   By: Gerome Sam III M.D   On: 03/27/2018 19:44   US Scrotum Doppler  Result Date: 03/27/2018 CLINICAL DATA:  Right testicle pain and swelling. EXAM: SCROTAL ULTRASOUND DOPPLER ULTRASOUND OF THE TESTICLES TECHNIQUE: Complete ultrasound examination of the testicles, epididymis, and other scrotal structures was performed. Color and spectral Doppler ultrasound were also utilized to evaluate blood flow to the testicles. COMPARISON:  CT abdomen pelvis dated July 10, 2011. FINDINGS: Right testicle Measurements: 5.1 x 1.9 x 3.1 cm. No mass or microlithiasis visualized. Left testicle Measurements: 5.0 x 2.1 x 3.2 cm. No mass or microlithiasis visualized. Right epididymis:  Normal in size and appearance. Left epididymis: Normal in size and appearance. 4 mm epididymal head cyst. Hydrocele:  Trace bilateral hydroceles. Varicocele:  None visualized. Pulsed Doppler interrogation of both testes demonstrates normal low resistance arterial and venous waveforms bilaterally. IMPRESSION: 1. Trace bilateral hydroceles. Otherwise unremarkable testicular ultrasound. Electronically Signed   By: Obie Dredge M.D.   On: 03/27/2018 18:08    Procedures Procedures (including critical care time)  Medications Ordered in ED Medications  morphine 4 MG/ML injection 4 mg (4 mg Intravenous Given 03/27/18 1722)  iopamidol (ISOVUE-300) 61 % injection 100 mL (100 mLs Intravenous Contrast Given 03/27/18 1902)  cefTRIAXone (ROCEPHIN) 1 g in sodium chloride 0.9 % 100 mL IVPB (0 g Intravenous Stopped 03/27/18 2041)  morphine 4 MG/ML injection  4 mg (4 mg Intravenous Given 03/27/18 2004)     Initial Impression / Assessment and Plan / ED Course  I have reviewed the triage vital signs and the nursing notes.  Pertinent labs & imaging results that were available during my care of the patient were reviewed by me and considered in my medical decision making (see chart for details).     Concern for possible testicular torsion.  Will get testicular ultrasound. His pain is significantly improved.  Scrotal ultrasound without evidence of testicular torsion.  Patient does have numerous white blood cells in his urine.  CT abdomen pelvis without acute findings.  Question epididymitis/orchitis.  Given dose of IV Rocephin and will discharge home with course of doxycycline.  Has a follow-up appointment in the health department this week.  Understands need to follow-up with urology if his testicular symptoms do not improve.  Understands the need to return immediately for worsening symptoms or concerns. Final Clinical Impressions(s) / ED Diagnoses   Final diagnoses:  Testicular discomfort  Urinary tract infection with hematuria, site unspecified    ED Discharge Orders         Ordered    HYDROcodone-acetaminophen (NORCO/VICODIN) 5-325 MG  tablet  Every 6 hours PRN     03/27/18 2056    doxycycline (VIBRAMYCIN) 100 MG capsule  2 times daily     03/27/18 2056           Loren Racer, MD 03/27/18 [phone]

## 2018-03-27 NOTE — ED Notes (Signed)
Pt taken to ct 

## 2018-03-27 NOTE — ED Notes (Addendum)
Pt having to bend over to walk to bathroom due to pain. Refused w/c. Pt c/o right testicle looks swollen and burns with urination

## 2018-03-27 NOTE — ED Triage Notes (Signed)
RLQ pain that radiates to rt groin that started today.  Pt reports burning with urination and some blood in urine x 1 week.

## 2018-03-29 LAB — URINE CULTURE

## 2018-03-30 MED FILL — Hydrocodone-Acetaminophen Tab 5-325 MG: ORAL | Qty: 6 | Status: AC

## 2018-03-30 MED FILL — Oxycodone w/ Acetaminophen Tab 5-325 MG: ORAL | Qty: 6 | Status: CN

## 2019-02-20 ENCOUNTER — Other Ambulatory Visit: Payer: Self-pay

## 2019-02-20 ENCOUNTER — Emergency Department (HOSPITAL_COMMUNITY)
Admission: EM | Admit: 2019-02-20 | Discharge: 2019-02-21 | Disposition: A | Discharge status: Home / Self Care | Payer: Self-pay | Attending: Emergency Medicine | Admitting: Emergency Medicine

## 2019-02-20 ENCOUNTER — Encounter (HOSPITAL_COMMUNITY): Payer: Self-pay | Admitting: *Deleted

## 2019-02-20 DIAGNOSIS — F17210 Nicotine dependence, cigarettes, uncomplicated: Secondary | ICD-10-CM | POA: Insufficient documentation

## 2019-02-20 DIAGNOSIS — K0889 Other specified disorders of teeth and supporting structures: Secondary | ICD-10-CM | POA: Insufficient documentation

## 2019-02-20 NOTE — ED Triage Notes (Signed)
Pt c/o left upper and bottom tooth and gum pain x 3 hours; pt states he has a tooth that has deteriorated

## 2019-02-21 MED ORDER — IBUPROFEN 800 MG PO TABS: 800.0000 mg | ORAL_TABLET | Freq: Three times a day (TID) | ORAL | 0 refills | Status: DC

## 2019-02-21 MED ORDER — KETOROLAC TROMETHAMINE 60 MG/2ML IM SOLN
60.0000 mg | Freq: Once | INTRAMUSCULAR | Status: AC
  Administered 2019-02-21: 01:00:00 60 mg via INTRAMUSCULAR
  Filled 2019-02-21: qty 2

## 2019-02-21 MED ORDER — PENICILLIN V POTASSIUM 250 MG PO TABS
500.0000 mg | ORAL_TABLET | Freq: Once | ORAL | Status: AC
  Administered 2019-02-21: 500 mg via ORAL
  Filled 2019-02-21: qty 2

## 2019-02-21 MED ORDER — PENICILLIN V POTASSIUM 500 MG PO TABS: 500.0000 mg | ORAL_TABLET | Freq: Four times a day (QID) | ORAL | 0 refills | Status: AC

## 2019-02-21 MED ORDER — OXYCODONE-ACETAMINOPHEN 5-325 MG PO TABS
2.0000 | ORAL_TABLET | Freq: Once | ORAL | Status: AC
  Administered 2019-02-21: 2 via ORAL
  Filled 2019-02-21: qty 2

## 2019-02-21 NOTE — ED Provider Notes (Signed)
Emergency Department Provider Note   I have reviewed the triage vital signs and the nursing notes.   HISTORY  Chief Complaint Dental Pain   HPI Aaron Francis is a 28 y.o. male who presents to the emergency department for toothache.  Patient states that he has had right upper and left lower tooth pain for about 6 months but over the last 24 to 36 hours is a progressively and significantly worsening left upper tooth pain associated with a fractured tooth.  Has not tried anything for it at home.  He has no trismus, trouble swallowing or trouble breathing.  Has not seen dentist recently.   No other associated or modifying symptoms.    History reviewed. No pertinent past medical history.  There are no active problems to display for this patient.   Past Surgical History:  Procedure Laterality Date  . TONSILLECTOMY      Current Outpatient Rx  . Order #: [id number] Class: Print  . Order #: [id number] Class: Print  . Order #: [id number] Class: Normal  . Order #: [id number] Class: Normal    Allergies Patient has no known allergies.  History reviewed. No pertinent family history.  Social History Social History   Tobacco Use  . Smoking status: Current Every Day Smoker    Packs/day: 0.25    Types: Cigarettes  . Smokeless tobacco: Never Used  Substance Use Topics  . Alcohol use: Yes    Comment: occ  . Drug use: No    Review of Systems  All other systems negative except as documented in the HPI. All pertinent positives and negatives as reviewed in the HPI. ____________________________________________   PHYSICAL EXAM:  VITAL SIGNS: ED Triage Vitals  Enc Vitals Group     BP 02/20/19 2352 (!) 148/94     Pulse Rate 02/20/19 2352 84     Resp 02/20/19 2352 16     Temp 02/20/19 2352 98.7 F (37.1 C)     Temp Source 02/20/19 2352 Oral     SpO2 02/20/19 2352 99 %     Weight 02/20/19 2353 152 lb (68.9 kg)     Height 02/20/19 2353 5\' 11"  (1.803 m)     Constitutional: Alert and oriented. Well appearing and in no acute distress. Eyes: Conjunctivae are normal. PERRL. EOMI. Head: Atraumatic. Nose: No congestion/rhinnorhea. Mouth/Throat: Mucous membranes are moist.  Oropharynx non-erythematous. Cracked tooth with gingival swelling on top left first molar without evidence for abscess. Multiple dental caries and missing teeth. No trismus. Neck: No stridor.  No meningeal signs.  No erythema, induration.  Cardiovascular: Normal rate, regular rhythm. Good peripheral circulation. Grossly normal heart sounds.   Respiratory: Normal respiratory effort.  No retractions. Lungs CTAB. Gastrointestinal: Soft and nontender. No distention.  Musculoskeletal: No lower extremity tenderness nor edema. No gross deformities of extremities. Neurologic:  Normal speech and language. No gross focal neurologic deficits are appreciated.  Skin:  Skin is warm, dry and intact. No rash noted.  ____________________________________________   INITIAL IMPRESSION / ASSESSMENT AND PLAN / ED COURSE  Dental pain with likely mild infection.  No evidence for deep neck space infection.  No sepsis.  No trismus.  Patient stable for discharge with dental follow-up.  Pertinent labs & imaging results that were available during my care of the patient were reviewed by me and considered in my medical decision making (see chart for details).  A medical screening exam was performed and I feel the patient has had an appropriate workup for their chief complaint at  this time and likelihood of emergent condition existing is low. They have been counseled on decision, discharge, follow up and which symptoms necessitate immediate return to the emergency department. They or their family verbally stated understanding and agreement with plan and discharged in stable condition.   ____________________________________________  FINAL CLINICAL IMPRESSION(S) / ED DIAGNOSES  Final diagnoses:  Pain, dental      MEDICATIONS GIVEN DURING THIS VISIT:  Medications  oxyCODONE-acetaminophen (PERCOCET/ROXICET) 5-325 MG per tablet 2 tablet (has no administration in time range)  ketorolac (TORADOL) injection 60 mg (has no administration in time range)  penicillin v potassium (VEETID) tablet 500 mg (has no administration in time range)     NEW OUTPATIENT MEDICATIONS STARTED DURING THIS VISIT:  New Prescriptions   IBUPROFEN (ADVIL) 800 MG TABLET    Take 1 tablet (800 mg total) by mouth 3 (three) times daily.   PENICILLIN V POTASSIUM (VEETID) 500 MG TABLET    Take 1 tablet (500 mg total) by mouth 4 (four) times daily for 7 days.    Note:  This note was prepared with assistance of Dragon voice recognition software. Occasional wrong-word or sound-a-like substitutions may have occurred due to the inherent limitations of voice recognition software.   Turner Kunzman, Corene Cornea, MD 02/21/19 [phone]

## 2020-02-22 IMAGING — CT CT ABD-PELV W/ CM
2 of 4 series · 15 of 46 positions shown, 17 images · IV contrast (Isovue)
Comparison: CT scan July 10, 2011

CLINICAL DATA: Right lower quadrant pain, radiating to right groin.
Burning with urination. Blood in urine for 1 week.

EXAM:
CT ABDOMEN AND PELVIS WITH CONTRAST
TECHNIQUE: Multidetector CT imaging of the abdomen and pelvis was performed
using the standard protocol following bolus administration of
intravenous contrast.
CONTRAST:  100mL GC9NLS-GWW IOPAMIDOL (GC9NLS-GWW) INJECTION 61%

[Series 2: axial st · axial · 0.64mm/px · z∈[-690,-260]mm · 12 of 94 slices shown, 14 images]
[im 4/94  soft-tissue]
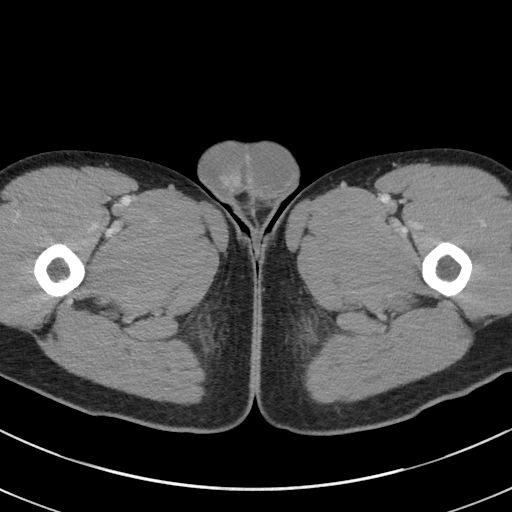
[im 4/94  bone]
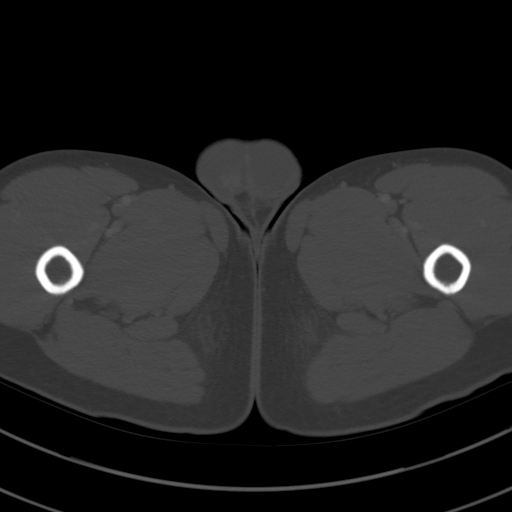
[im 12/94  soft-tissue]
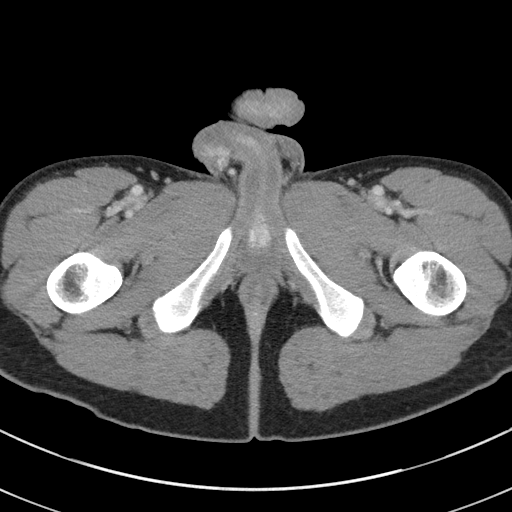
[im 20/94  soft-tissue]
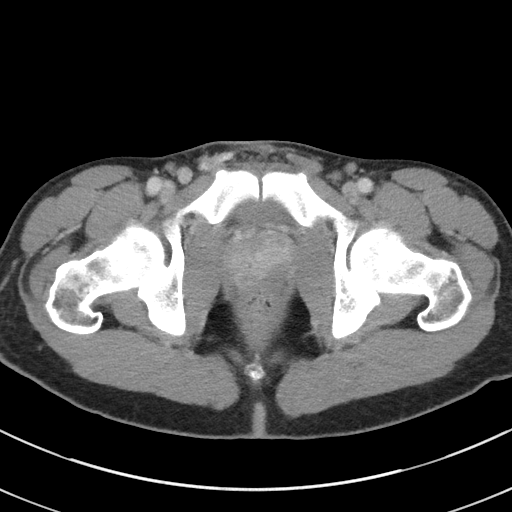
[im 28/94  soft-tissue]
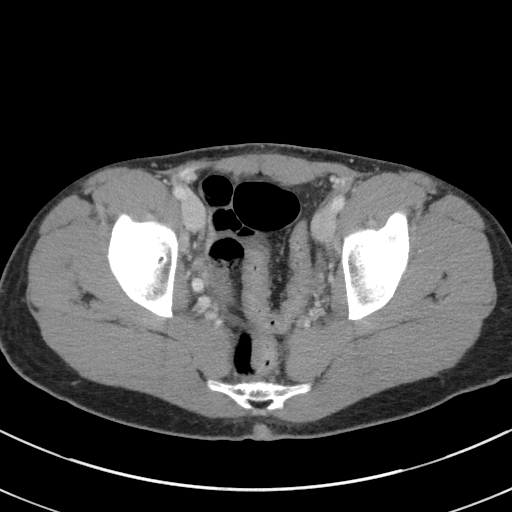
[im 35/94  soft-tissue]
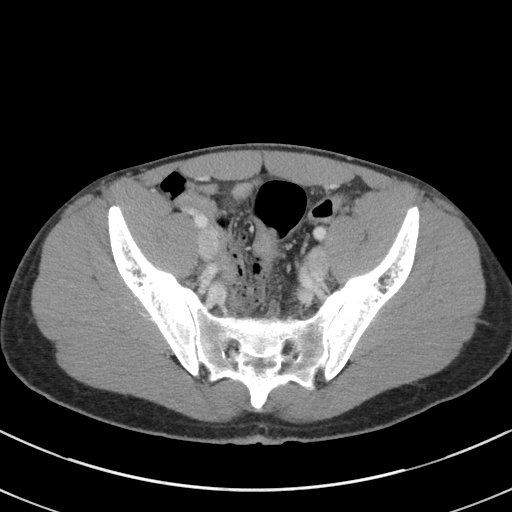
[im 43/94  soft-tissue]
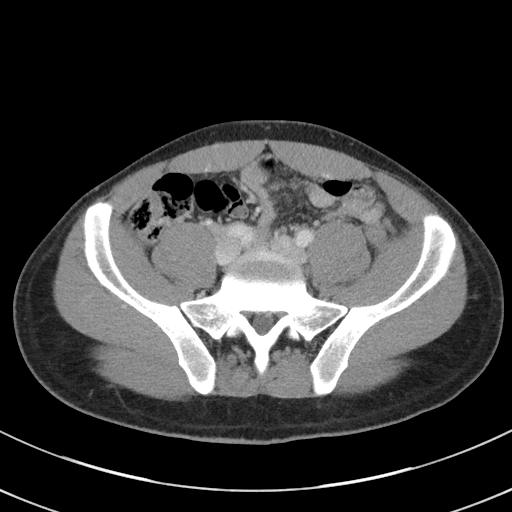
[im 51/94  soft-tissue]
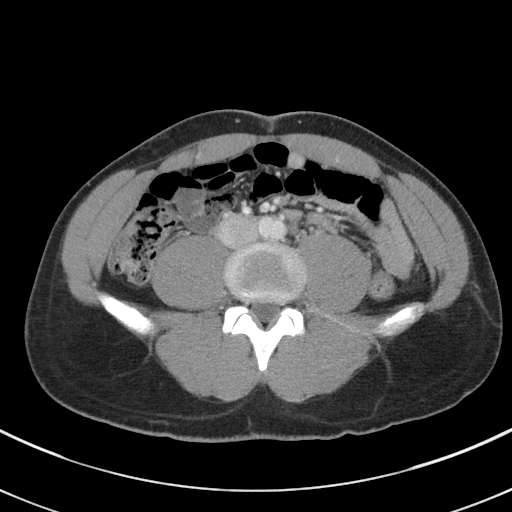
[im 59/94  soft-tissue]
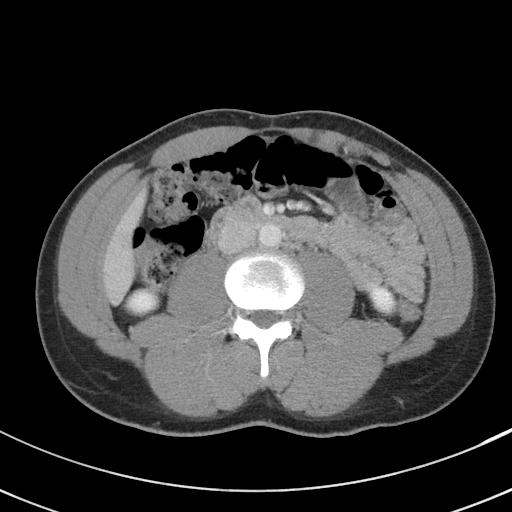
[im 66/94  soft-tissue]
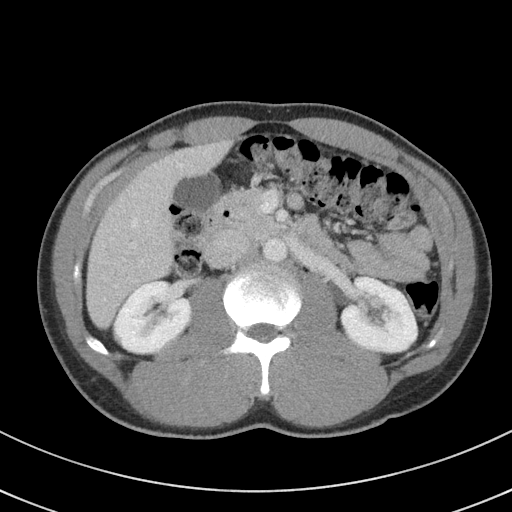
[im 66/94  bone]
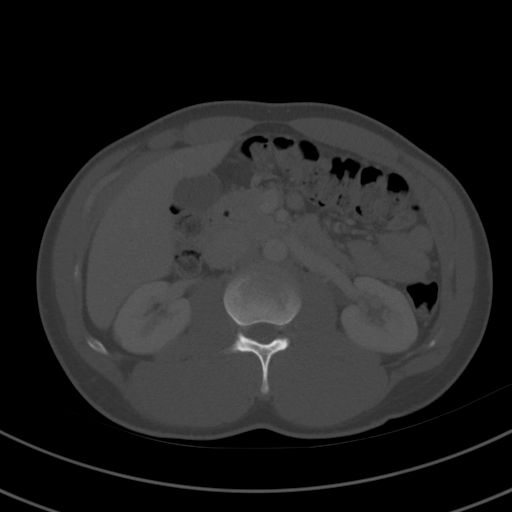
[im 74/94  soft-tissue]
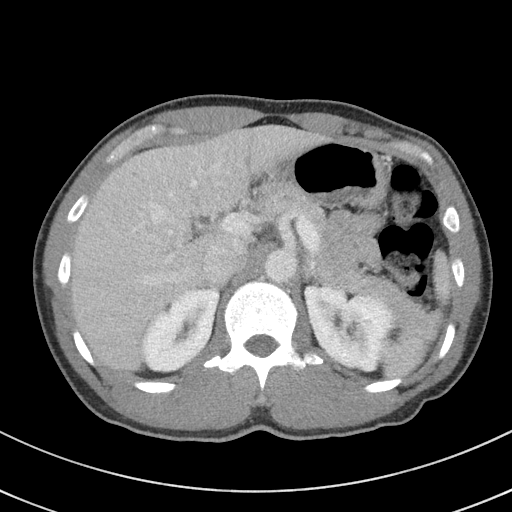
[im 82/94  soft-tissue]
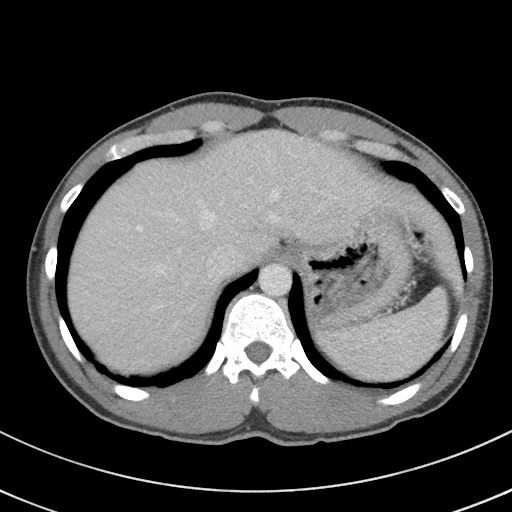
[im 90/94  soft-tissue]
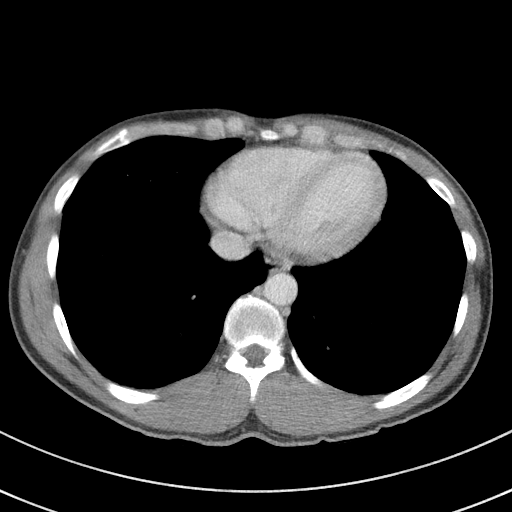

[Series 5: coronal st · coronal · 0.67mm/px · 3 of 81 slices shown]
[im 27/81  soft-tissue]
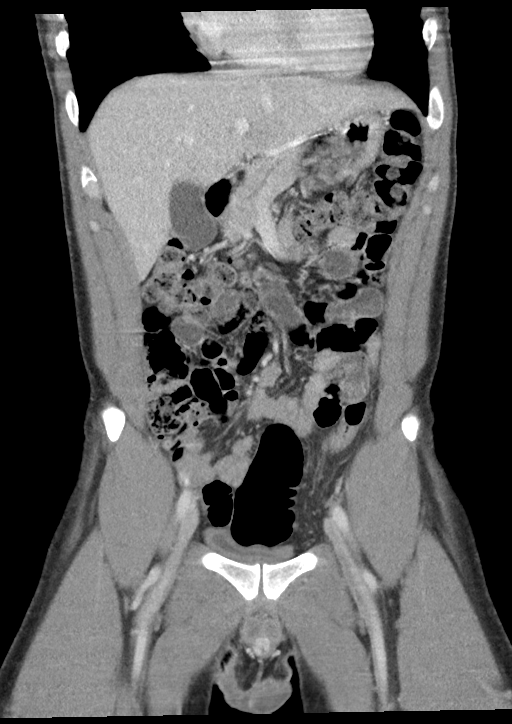
[im 36/81  soft-tissue]
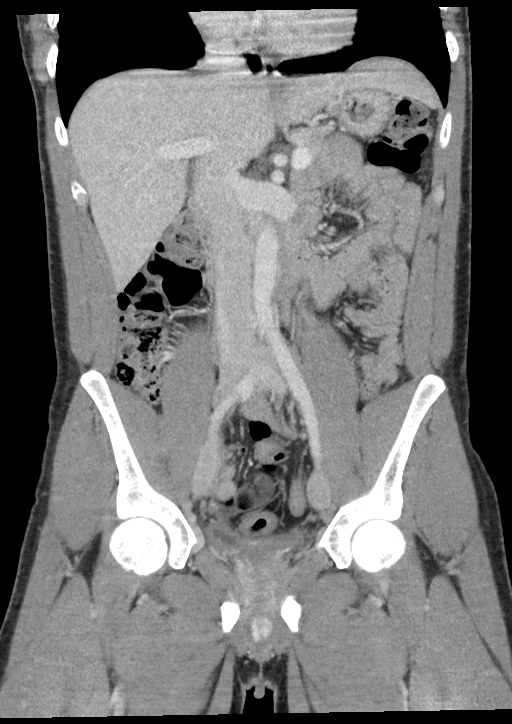
[im 45/81  soft-tissue]
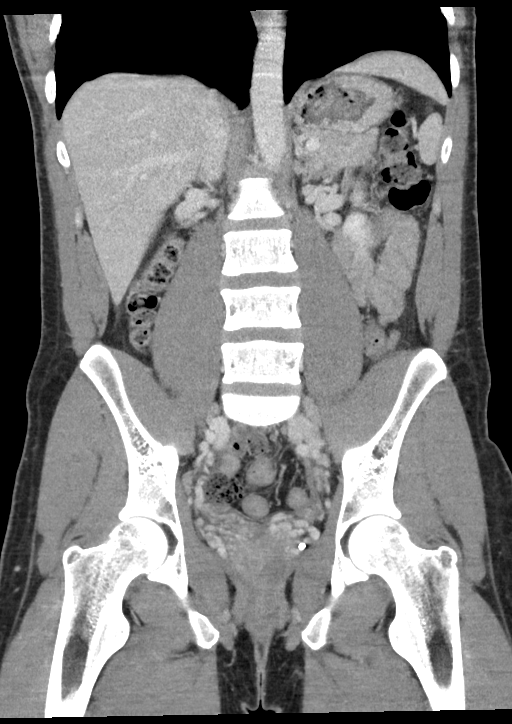

[15 of 46 positions shown; findings below may reference images not displayed]

FINDINGS: Lower chest: No acute abnormality.

Hepatobiliary: No focal liver abnormality is seen. No gallstones,
gallbladder wall thickening, or biliary dilatation.

Pancreas: Unremarkable. No pancreatic ductal dilatation or
surrounding inflammatory changes.

Spleen: Normal in size without focal abnormality.

Adrenals/Urinary Tract: The adrenal glands are normal. No renal
masses, stones, hydronephrosis, or perinephric stranding. No
ureterectasis. No definitive ureteral stones. There are phleboliths
in the pelvis. There is a calcification in the left pelvis on series
2, image 69. I suspect this is a phlebolith rather than a stone in
the ureter. The bladder is poorly distended but appears thick walled
with some mild increased attenuation in the adjacent fat. No other
abnormalities.

Stomach/Bowel: The stomach and small bowel are normal. The colon is
unremarkable. Visualized portions of the appendix, best seen on the
coronal view, are normal. No evidence of appendicitis.

Vascular/Lymphatic: No significant vascular findings are present. No
enlarged abdominal or pelvic lymph nodes.

Reproductive: The prostate is unremarkable in appearance. There are
prominent pelvic vessels bilaterally. There is a right-sided
hydrocele. There may be a right-sided varicocele as well.

Other: There is free fluid in the pelvis.  No free air.

Musculoskeletal: No acute or significant osseous findings.
IMPRESSION: 1. No renal stones or obstruction. No definitive ureteral stones. A
calcification in the left pelvis is favored to represent a
phlebolith.
2. The bladder is thick walled with some mild adjacent fat
stranding. This raises the possibility of cystitis. Recommend
clinical correlation and correlation with urinalysis.
3. Prominent pelvic vasculature of uncertain etiology or
significance.
4. Small right-sided hydrocele.  Possible right-sided varicocele.
5. Free fluid in the pelvis.

## 2020-02-22 IMAGING — US US SCROTUM W/ DOPPLER COMPLETE
1 series · 14 of 25 positions shown · non-contrast
Comparison: CT abdomen pelvis dated July 10, 2011.

CLINICAL DATA: Right testicle pain and swelling.

EXAM:
SCROTAL ULTRASOUND
DOPPLER ULTRASOUND OF THE TESTICLES
TECHNIQUE: Complete ultrasound examination of the testicles, epididymis, and
other scrotal structures was performed. Color and spectral Doppler
ultrasound were also utilized to evaluate blood flow to the
testicles.

[Series 1: us scrotum w/ doppler complete · 14 of 63 slices shown]
[im 1/63]
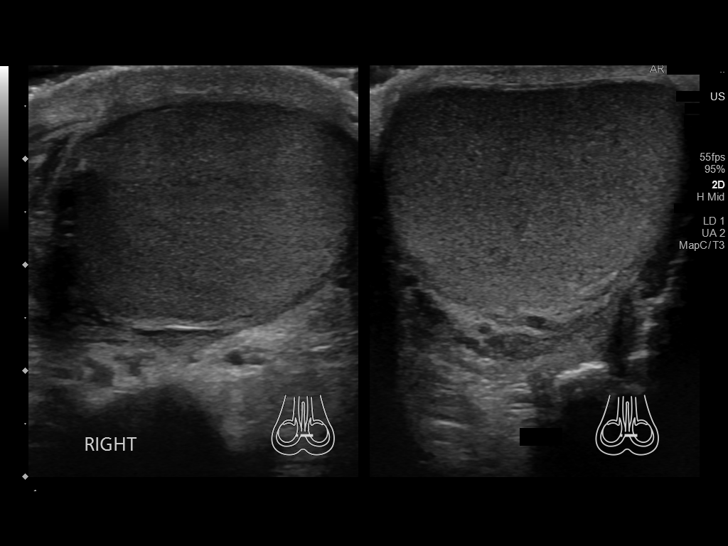
[im 6/63]
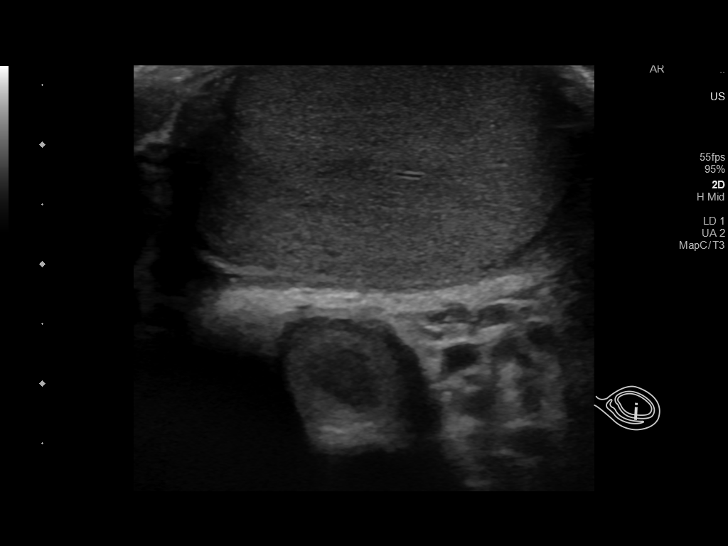
[im 11/63]
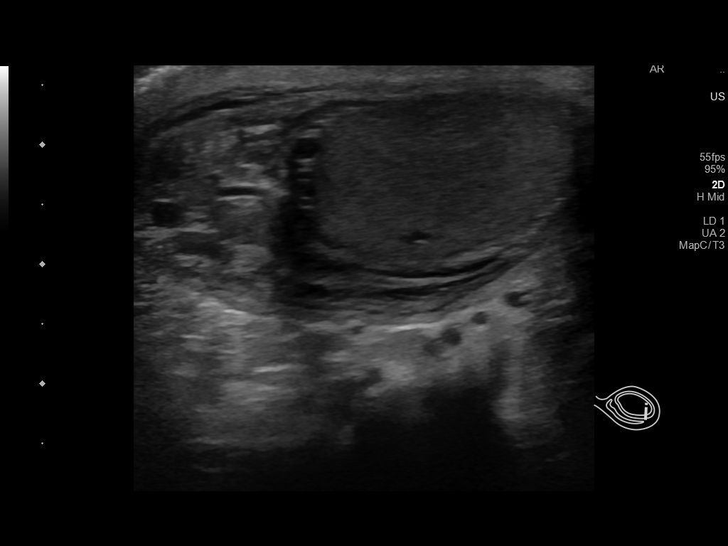
[im 16/63]
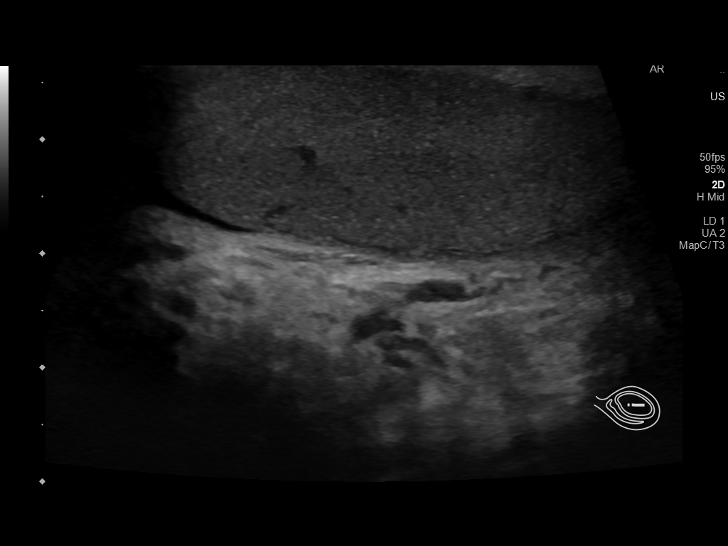
[im 21/63]
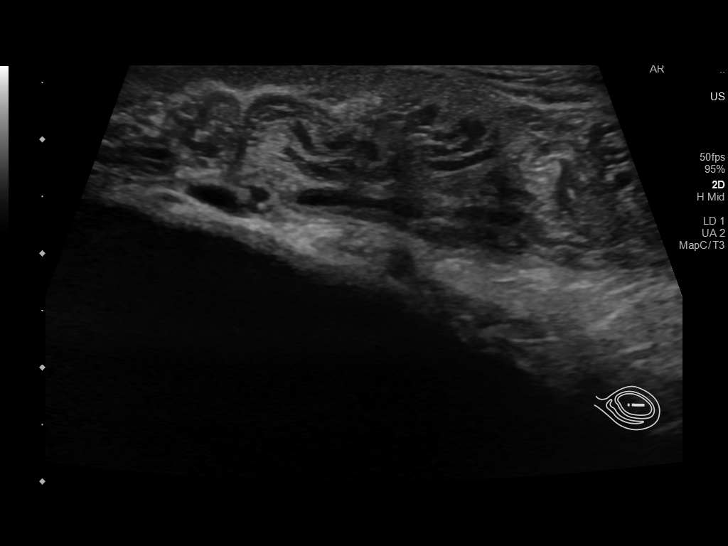
[im 24/63]
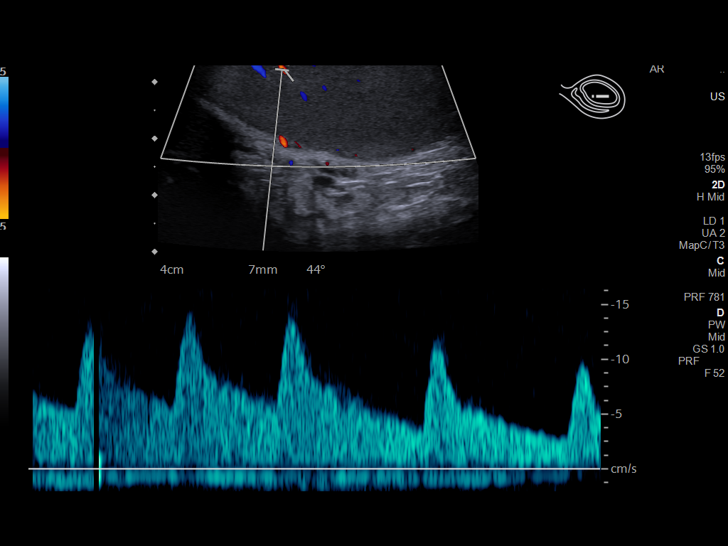
[im 29/63]
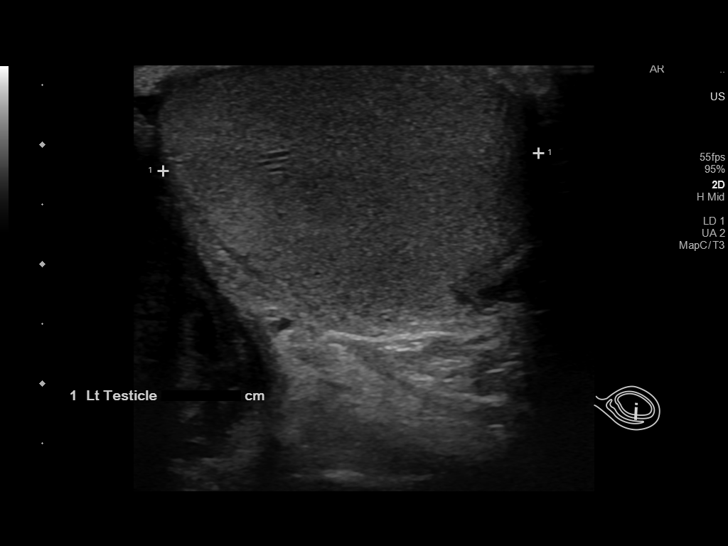
[im 34/63]
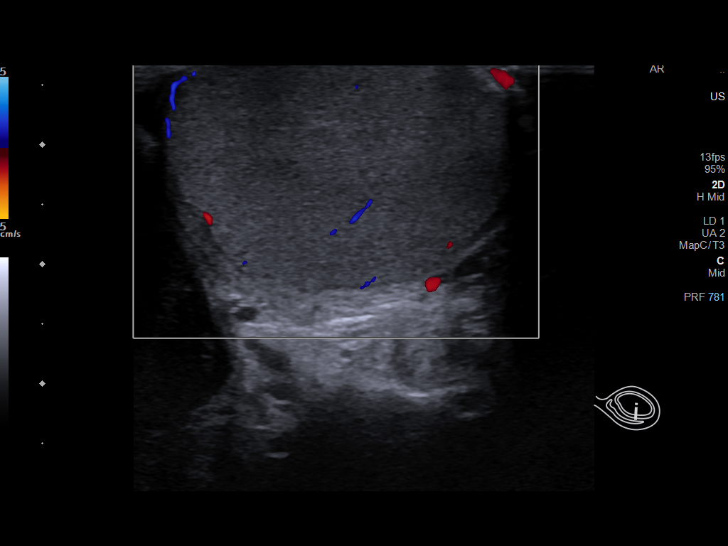
[im 39/63]
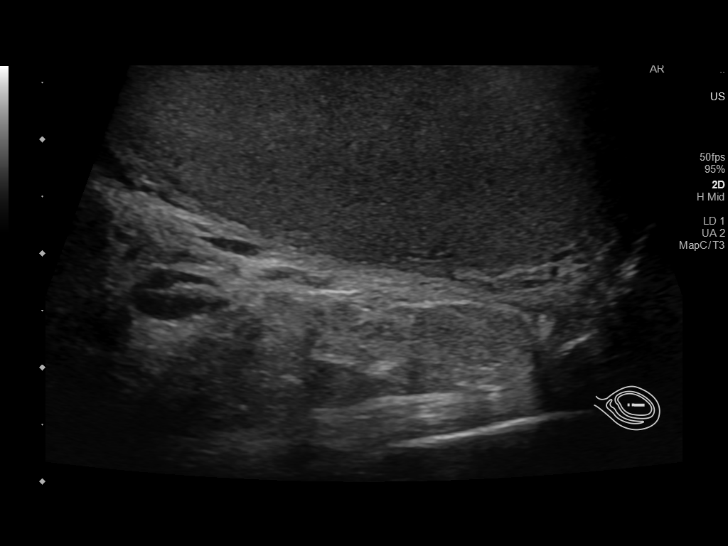
[im 42/63]
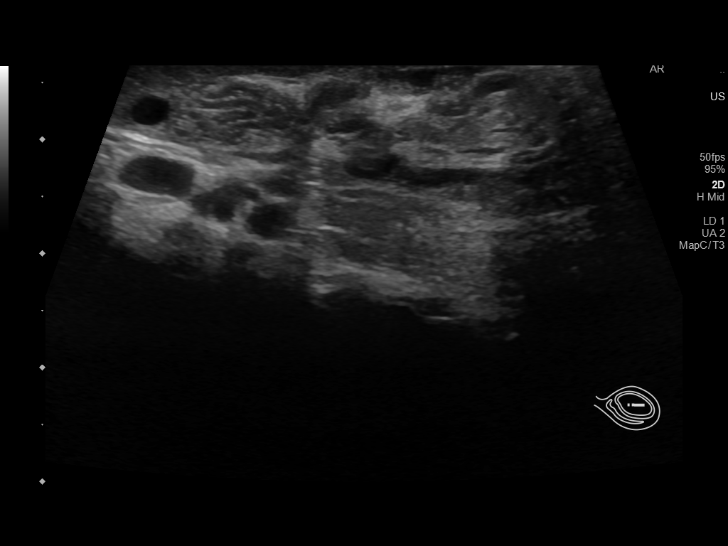
[im 47/63]
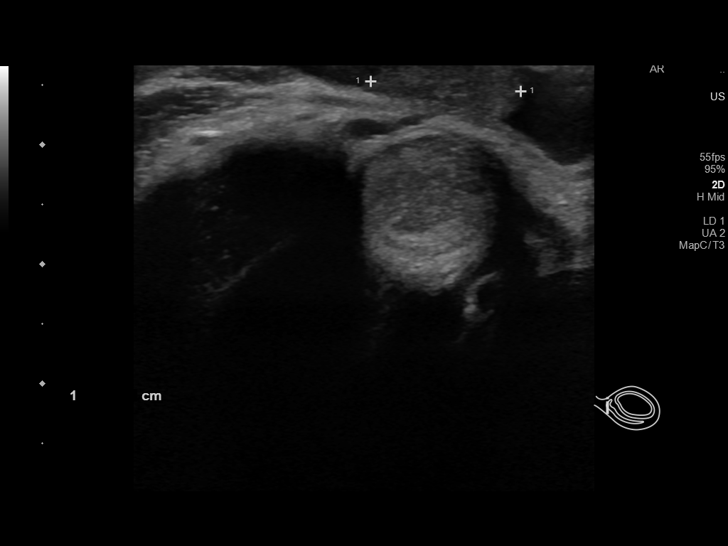
[im 52/63]
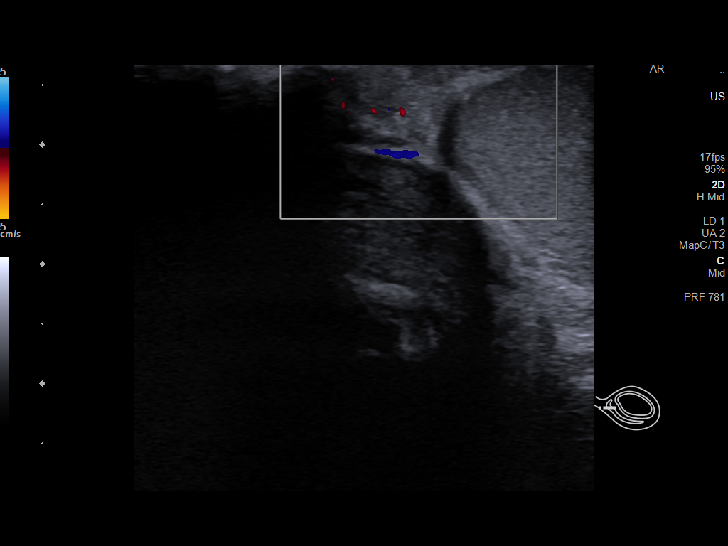
[im 57/63]
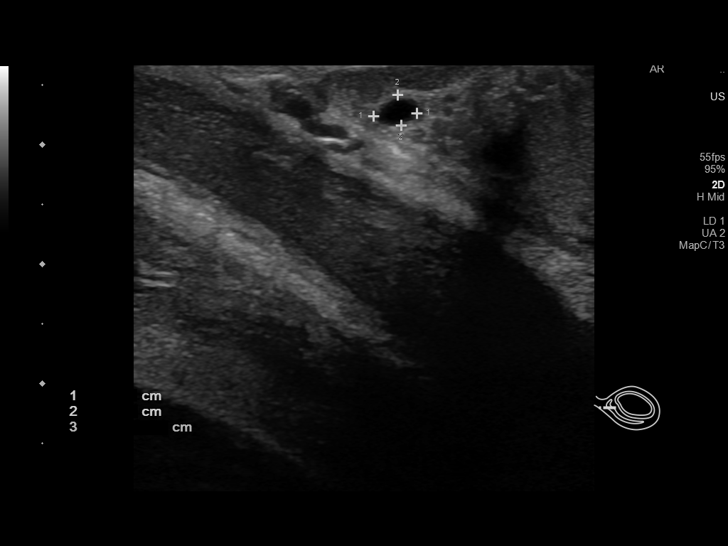
[im 63/63]
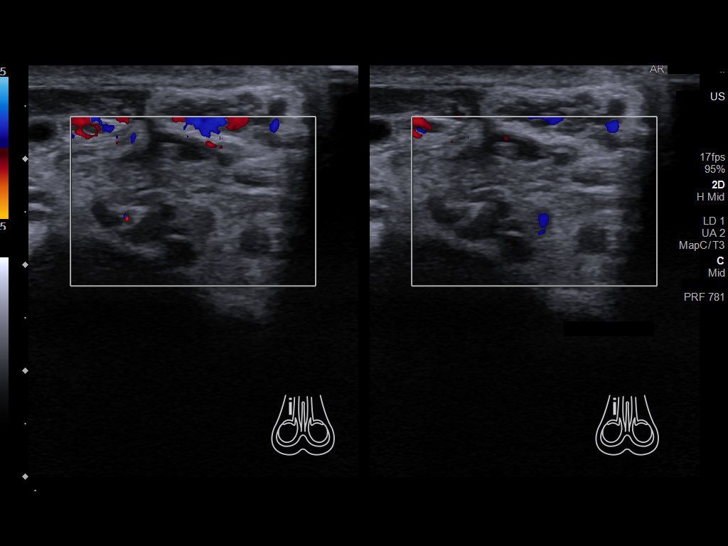

[14 of 25 positions shown; findings below may reference images not displayed]

FINDINGS: Right testicle

Measurements: 5.1 x 1.9 x 3.1 cm. No mass or microlithiasis
visualized.

Left testicle

Measurements: 5.0 x 2.1 x 3.2 cm. No mass or microlithiasis
visualized.

Right epididymis:  Normal in size and appearance.

Left epididymis: Normal in size and appearance. 4 mm epididymal head
cyst.

Hydrocele:  Trace bilateral hydroceles.

Varicocele:  None visualized.

Pulsed Doppler interrogation of both testes demonstrates normal low
resistance arterial and venous waveforms bilaterally.
IMPRESSION: 1. Trace bilateral hydroceles. Otherwise unremarkable testicular
ultrasound.

## 2021-07-30 ENCOUNTER — Emergency Department (HOSPITAL_COMMUNITY)
Admission: EM | Admit: 2021-07-30 | Discharge: 2021-07-30 | Disposition: A | Discharge status: Home / Self Care | Payer: Self-pay | Attending: Emergency Medicine | Admitting: Emergency Medicine

## 2021-07-30 ENCOUNTER — Other Ambulatory Visit: Payer: Self-pay

## 2021-07-30 ENCOUNTER — Encounter (HOSPITAL_COMMUNITY): Payer: Self-pay

## 2021-07-30 DIAGNOSIS — K029 Dental caries, unspecified: Secondary | ICD-10-CM | POA: Insufficient documentation

## 2021-07-30 MED ORDER — AMOXICILLIN 250 MG PO CAPS
500.0000 mg | ORAL_CAPSULE | Freq: Once | ORAL | Status: AC
  Administered 2021-07-30: 500 mg via ORAL
  Filled 2021-07-30: qty 2

## 2021-07-30 MED ORDER — HYDROCODONE-ACETAMINOPHEN 5-325 MG PO TABS: 1.0000 | ORAL_TABLET | ORAL | 0 refills | Status: AC | PRN

## 2021-07-30 MED ORDER — IBUPROFEN 600 MG PO TABS: 600.0000 mg | ORAL_TABLET | Freq: Four times a day (QID) | ORAL | 0 refills | Status: DC | PRN

## 2021-07-30 MED ORDER — HYDROCODONE-ACETAMINOPHEN 5-325 MG PO TABS
1.0000 | ORAL_TABLET | Freq: Once | ORAL | Status: AC
  Administered 2021-07-30: 1 via ORAL
  Filled 2021-07-30: qty 1

## 2021-07-30 MED ORDER — IBUPROFEN 400 MG PO TABS
600.0000 mg | ORAL_TABLET | Freq: Once | ORAL | Status: AC
  Administered 2021-07-30: 600 mg via ORAL
  Filled 2021-07-30: qty 2

## 2021-07-30 MED ORDER — AMOXICILLIN 500 MG PO CAPS: 500.0000 mg | ORAL_CAPSULE | Freq: Three times a day (TID) | ORAL | 0 refills | Status: DC

## 2021-07-30 NOTE — ED Provider Notes (Signed)
?[facility] EMERGENCY DEPARTMENT ?Provider Note ? ? ?CSN: [id number] ?Arrival date & time: 07/30/21  3419 ? ?  ? ?History ? ?Chief Complaint  ?Patient presents with  ? Dental Pain  ? ? ?Aaron Francis is a 31 y.o. male. ? ?Pt is a 31 yo male with no significant pmhx.  He said he has had dental pain for 2 days.  He does not have a dentist.  He denies any fever or swelling.   ? ? ?  ? ?Home Medications ?Prior to Admission medications   ?Medication Sig Start Date End Date Taking? Authorizing Provider  ?amoxicillin (AMOXIL) 500 MG capsule Take 1 capsule (500 mg total) by mouth 3 (three) times daily. 07/30/21  Yes Jacalyn Lefevre, MD  ?HYDROcodone-acetaminophen (NORCO/VICODIN) 5-325 MG tablet Take 1 tablet by mouth every 4 (four) hours as needed. 07/30/21  Yes Jacalyn Lefevre, MD  ?ibuprofen (ADVIL) 600 MG tablet Take 1 tablet (600 mg total) by mouth every 6 (six) hours as needed. 07/30/21  Yes Jacalyn Lefevre, MD  ?doxycycline (VIBRAMYCIN) 100 MG capsule Take 1 capsule (100 mg total) by mouth 2 (two) times daily. One po bid x 7 days 03/27/18   Loren Racer, MD  ?   ? ?Allergies    ?Patient has no known allergies.   ? ?Review of Systems   ?Review of Systems  ?HENT:  Positive for dental problem.   ?All other systems reviewed and are negative. ? ?Physical Exam ?Updated Vital Signs ?BP (!) 149/95 (BP Location: Right Arm)   Pulse 87   Temp 98.4 ?F (36.9 ?C) (Oral)   Resp 18   Ht 5\' 11"  (1.803 m)   Wt 66.2 kg   SpO2 100%   BMI 20.36 kg/m?  ?Physical Exam ?Vitals and nursing note reviewed.  ?Constitutional:   ?   Appearance: Normal appearance.  ?HENT:  ?   Head: Normocephalic and atraumatic.  ?   Right Ear: External ear normal.  ?   Left Ear: External ear normal.  ?   Nose: Nose normal.  ?   Mouth/Throat:  ?   Mouth: Mucous membranes are moist.  ?   Dentition: Dental caries present.  ?   Comments: Multiple dental caries  ?Eyes:  ?   Extraocular Movements: Extraocular movements intact.  ?   Conjunctiva/sclera:  Conjunctivae normal.  ?   Pupils: Pupils are equal, round, and reactive to light.  ?Cardiovascular:  ?   Rate and Rhythm: Normal rate and regular rhythm.  ?   Pulses: Normal pulses.  ?   Heart sounds: Normal heart sounds.  ?Pulmonary:  ?   Effort: Pulmonary effort is normal.  ?   Breath sounds: Normal breath sounds.  ?Abdominal:  ?   General: Abdomen is flat.  ?Musculoskeletal:     ?   General: Normal range of motion.  ?   Cervical back: Normal range of motion and neck supple.  ?Skin: ?   General: Skin is warm.  ?   Capillary Refill: Capillary refill takes less than 2 seconds.  ?Neurological:  ?   General: No focal deficit present.  ?   Mental Status: He is alert and oriented to person, place, and time.  ?Psychiatric:     ?   Mood and Affect: Mood normal.     ?   Behavior: Behavior normal.  ? ? ?ED Results / Procedures / Treatments   ?Labs ?(all labs ordered are listed, but only abnormal results are displayed) ?Labs Reviewed - No  data to display ? ?EKG ?None ? ?Radiology ?No results found. ? ?Procedures ?Procedures  ? ? ?Medications Ordered in ED ?Medications  ?amoxicillin (AMOXIL) capsule 500 mg (has no administration in time range)  ?HYDROcodone-acetaminophen (NORCO/VICODIN) 5-325 MG per tablet 1 tablet (has no administration in time range)  ?ibuprofen (ADVIL) tablet 600 mg (has no administration in time range)  ? ? ?ED Course/ Medical Decision Making/ A&P ?  ?                        ?Medical Decision Making ?Risk ?Prescription drug management. ? ? ?This patient presents to the ED for concern of dental pain, this involves an extensive number of treatment options, and is a complaint that carries with it a high risk of complications and morbidity.  The differential diagnosis includes dental caries/abscess ?Medicines ordered and prescription drug management: ? ?I ordered medication including amox/ibuprofen/lortab  for dental caries and pain  ?Reevaluation of the patient after these medicines showed that the patient  stayed the same ?I have reviewed the patients home medicines and have made adjustments as needed ? ? ? ?Problem List / ED Course: ? ?Dental caries:  will start pt on amox, ibuprofen, and lortab.  He needs to f/u with a dentist.  He is given the [facility] phone number as he has no insurance. ? ? ?Social Determinants of Health: ? ?No insurance limiting access to dentists ? ? ?Dispostion: ? ?After consideration of the diagnostic results and the patients response to treatment, I feel that the patent would benefit from d/c with outpatient f/u.   ? ? ? ? ? ? ? ?Final Clinical Impression(s) / ED Diagnoses ?Final diagnoses:  ?Dental caries  ? ? ?Rx / DC Orders ?ED Discharge Orders   ? ?      Ordered  ?  amoxicillin (AMOXIL) 500 MG capsule  3 times daily       ? 07/30/21 0742  ?  ibuprofen (ADVIL) 600 MG tablet  Every 6 hours PRN       ? 07/30/21 0742  ?  HYDROcodone-acetaminophen (NORCO/VICODIN) 5-325 MG tablet  Every 4 hours PRN       ? 07/30/21 0742  ? ?  ?  ? ?  ? ? ?  ?Jacalyn Lefevre, MD ?07/30/21 [phone] ? ?

## 2021-07-30 NOTE — ED Triage Notes (Signed)
Pt presents to ED with complaints of right upper tooth pain x 2 days. ?

## 2022-01-07 ENCOUNTER — Other Ambulatory Visit: Payer: Self-pay

## 2022-01-07 ENCOUNTER — Encounter (HOSPITAL_COMMUNITY): Payer: Self-pay | Admitting: Emergency Medicine

## 2022-01-07 ENCOUNTER — Emergency Department (HOSPITAL_COMMUNITY)
Admission: EM | Admit: 2022-01-07 | Discharge: 2022-01-07 | Disposition: A | Discharge status: Home / Self Care | Payer: Self-pay | Attending: Emergency Medicine | Admitting: Emergency Medicine

## 2022-01-07 DIAGNOSIS — K0889 Other specified disorders of teeth and supporting structures: Secondary | ICD-10-CM

## 2022-01-07 DIAGNOSIS — K029 Dental caries, unspecified: Secondary | ICD-10-CM | POA: Insufficient documentation

## 2022-01-07 MED ORDER — HYDROCODONE-ACETAMINOPHEN 5-325 MG PO TABS: 2.0000 | ORAL_TABLET | Freq: Once | ORAL | Status: DC

## 2022-01-07 MED ORDER — AMOXICILLIN-POT CLAVULANATE 875-125 MG PO TABS
1.0000 | ORAL_TABLET | Freq: Once | ORAL | Status: AC
  Administered 2022-01-07: 1 via ORAL
  Filled 2022-01-07: qty 1

## 2022-01-07 MED ORDER — KETOROLAC TROMETHAMINE 15 MG/ML IJ SOLN
15.0000 mg | Freq: Once | INTRAMUSCULAR | Status: AC
Start: 2022-01-07 — End: 2022-01-07
  Administered 2022-01-07: 15 mg via INTRAMUSCULAR
  Filled 2022-01-07: qty 1

## 2022-01-07 MED ORDER — AMOXICILLIN-POT CLAVULANATE 875-125 MG PO TABS: 1.0000 | ORAL_TABLET | Freq: Two times a day (BID) | ORAL | 0 refills | Status: DC

## 2022-01-07 MED ORDER — LIDOCAINE VISCOUS HCL 2 % MT SOLN
15.0000 mL | Freq: Once | OROMUCOSAL | Status: AC
  Administered 2022-01-07: 15 mL via OROMUCOSAL
  Filled 2022-01-07: qty 15

## 2022-01-07 NOTE — ED Triage Notes (Signed)
Pt presents with dental caries to right and left upper teeth.

## 2022-01-07 NOTE — Discharge Instructions (Addendum)
Keep your dentist appointment for couple weeks.  Please take antibiotics as prescribed.  Take 600 mg ibuprofen every 6 hours for pain.

## 2022-01-07 NOTE — ED Notes (Signed)
Pt readmitted to print work note for today.

## 2022-01-07 NOTE — ED Provider Notes (Signed)
[facility] EMERGENCY DEPARTMENT Provider Note   CSN: [id number] Arrival date & time: 01/07/22  1654     History Chief Complaint  Patient presents with   Dental Pain    Aaron Francis is a 31 y.o. male patient who presents to the emergency department today for further evaluation of dental pain has been ongoing for several days.  Patient has an appointment with his dentist in a couple weeks.  He denies any fever or chills.  He endorses some gingival swelling in the upper left and right gum lines towards the lateral incisors.  Denies any trouble breathing or swallowing.   Dental Pain      Home Medications Prior to Admission medications   Medication Sig Start Date End Date Taking? Authorizing Provider  amoxicillin-clavulanate (AUGMENTIN) 875-125 MG tablet Take 1 tablet by mouth every 12 (twelve) hours. 01/07/22  Yes Meredeth Ide, Rusty Villella M, PA-C  HYDROcodone-acetaminophen (NORCO/VICODIN) 5-325 MG tablet Take 1 tablet by mouth every 4 (four) hours as needed. 07/30/21   Jacalyn Lefevre, MD  ibuprofen (ADVIL) 600 MG tablet Take 1 tablet (600 mg total) by mouth every 6 (six) hours as needed. 07/30/21   Jacalyn Lefevre, MD      Allergies    Patient has no known allergies.    Review of Systems   Review of Systems  All other systems reviewed and are negative.   Physical Exam Updated Vital Signs BP (!) 134/91   Pulse 84   Temp 98.5 F (36.9 C) (Oral)   Resp 16   Ht 5\' 11"  (1.803 m)   Wt 64.2 kg   SpO2 97%   BMI 19.75 kg/m  Physical Exam Vitals and nursing note reviewed.  Constitutional:      Appearance: Normal appearance.  HENT:     Head: Normocephalic and atraumatic.     Mouth/Throat:     Comments: Numerous dental caries.  Posterior pharynx is normal.  Uvula midline.  Do not see any obvious fluctuance or significant gingival swelling.  Patient talking complete sentences. Eyes:     General:        Right eye: No discharge.        Left eye: No discharge.      Conjunctiva/sclera: Conjunctivae normal.  Pulmonary:     Effort: Pulmonary effort is normal.  Skin:    General: Skin is warm and dry.     Findings: No rash.  Neurological:     General: No focal deficit present.     Mental Status: He is alert.  Psychiatric:        Mood and Affect: Mood normal.        Behavior: Behavior normal.     ED Results / Procedures / Treatments   Labs (all labs ordered are listed, but only abnormal results are displayed) Labs Reviewed - No data to display  EKG None  Radiology No results found.  Procedures Procedures    Medications Ordered in ED Medications  ketorolac (TORADOL) 15 MG/ML injection 15 mg (has no administration in time range)  amoxicillin-clavulanate (AUGMENTIN) 875-125 MG per tablet 1 tablet (1 tablet Oral Given 01/07/22 1906)  lidocaine (XYLOCAINE) 2 % viscous mouth solution 15 mL (15 mLs Mouth/Throat Given 01/07/22 1907)    ED Course/ Medical Decision Making/ A&P                           Medical Decision Making Aaron Francis is a 31 y.o. male patient  presents to the emerged from today for further evaluation of dental pain.  I have a low suspicion for Ludwig's angina, PTA, RPA.  This could be an early dental abscess although I do not see any evidence of obvious fluctuance today.  I instructed the patient to keep his appointment with the dentist.  I will give him a shot of Toradol, viscous lidocaine swish, and 1 Augmentin.  I will write him a prescription for Augmentin to go home with.  Patient amenable this plan.  He is safe for discharge at this time.  Strict turn precaution discussed.  All vitals are normal.   Risk Prescription drug management.   Final Clinical Impression(s) / ED Diagnoses Final diagnoses:  Pain, dental    Rx / DC Orders ED Discharge Orders          Ordered    amoxicillin-clavulanate (AUGMENTIN) 875-125 MG tablet  Every 12 hours        01/07/22 1856              Teressa Lower,  PA-C 01/07/22 Steva Colder, MD 01/07/22 [phone]

## 2022-01-13 ENCOUNTER — Emergency Department (HOSPITAL_COMMUNITY)
Admission: EM | Admit: 2022-01-13 | Discharge: 2022-01-13 | Disposition: A | Discharge status: Home / Self Care | Payer: Self-pay | Attending: Emergency Medicine | Admitting: Emergency Medicine

## 2022-01-13 ENCOUNTER — Encounter (HOSPITAL_COMMUNITY): Payer: Self-pay

## 2022-01-13 ENCOUNTER — Other Ambulatory Visit: Payer: Self-pay

## 2022-01-13 DIAGNOSIS — K0889 Other specified disorders of teeth and supporting structures: Secondary | ICD-10-CM | POA: Insufficient documentation

## 2022-01-13 MED ORDER — KETOROLAC TROMETHAMINE 15 MG/ML IJ SOLN
15.0000 mg | Freq: Once | INTRAMUSCULAR | Status: AC
  Administered 2022-01-13: 15 mg via INTRAMUSCULAR
  Filled 2022-01-13: qty 1

## 2022-01-13 MED ORDER — LIDOCAINE VISCOUS HCL 2 % MT SOLN: 15.0000 mL | OROMUCOSAL | 0 refills | Status: AC | PRN

## 2022-01-13 NOTE — ED Triage Notes (Addendum)
Pt c/o dental pain on upper teeth. Pt states toradol helped the other day, pt has been taking abx as prescribed. Pt has dental appt last week.

## 2022-01-13 NOTE — ED Provider Notes (Signed)
[facility] EMERGENCY DEPARTMENT Provider Note   CSN: [id number] Arrival date & time: 01/13/22  1753     History  Chief Complaint  Patient presents with   Dental Pain    Aaron Francis is a 31 y.o. male status post tonsillectomy with history of dental caries and dental infections.  Presents to the emergency department with a chief complaint of dental pain.  Patient reports that the dental pain has been going on since approximately 01/04/2022.  Reports that pain has been bothering him more over the last few days.  Pain is located to left upper and lower back molars  Pain has been taking Tylenol and ibuprofen at home with no relief of symptoms.  Patient has not had any ibuprofen or other NSAID medication today.  Patient reports that he has a appointment with his dentist scheduled for next week.  Patient denies any fever, chills, facial swelling, trismus, potato voice, trouble swallowing, drooling, neck pain, neck stiffness, new dental injury.   Dental Pain Associated symptoms: no drooling, no facial swelling, no fever and no neck pain        Home Medications Prior to Admission medications   Medication Sig Start Date End Date Taking? Authorizing Provider  amoxicillin-clavulanate (AUGMENTIN) 875-125 MG tablet Take 1 tablet by mouth every 12 (twelve) hours. 01/07/22   Teressa Lower, PA-C  HYDROcodone-acetaminophen (NORCO/VICODIN) 5-325 MG tablet Take 1 tablet by mouth every 4 (four) hours as needed. 07/30/21   Jacalyn Lefevre, MD  ibuprofen (ADVIL) 600 MG tablet Take 1 tablet (600 mg total) by mouth every 6 (six) hours as needed. 07/30/21   Jacalyn Lefevre, MD      Allergies    Patient has no known allergies.    Review of Systems   Review of Systems  Constitutional:  Negative for chills and fever.  HENT:  Positive for dental problem. Negative for drooling, facial swelling, sore throat, trouble swallowing and voice change.   Musculoskeletal:  Negative for neck pain and neck  stiffness.    Physical Exam Updated Vital Signs BP 130/81 (BP Location: Right Arm)   Pulse (!) 51   Temp 98.4 F (36.9 C) (Oral)   Resp 20   Ht 5\' 11"  (1.803 m)   Wt 64.4 kg   SpO2 100%   BMI 19.80 kg/m  Physical Exam Vitals and nursing note reviewed.  Constitutional:      General: He is not in acute distress.    Appearance: He is not ill-appearing, toxic-appearing or diaphoretic.  HENT:     Head: Normocephalic.     Jaw: There is normal jaw occlusion. No trismus, tenderness, swelling, pain on movement or malocclusion.     Mouth/Throat:     Lips: Pink. No lesions.     Mouth: Mucous membranes are moist. No angioedema.     Dentition: Dental tenderness and dental caries present. No gingival swelling or dental abscesses.     Tongue: No lesions. Tongue does not deviate from midline.     Palate: No mass and lesions.     Pharynx: Oropharynx is clear. Uvula midline. No pharyngeal swelling, oropharyngeal exudate, posterior oropharyngeal erythema or uvula swelling.     Tonsils: 0 on the right. 0 on the left.      Comments: Poor dentition with multiple dental caries.  Patient has tenderness as indicated above.  Handles oral secretions without difficulty.  No significant gingival swelling, no visualized dental abscess. Eyes:     General: No scleral icterus.  Right eye: No discharge.        Left eye: No discharge.  Neck:     Comments: No swelling to submandibular space. Cardiovascular:     Rate and Rhythm: Normal rate.  Pulmonary:     Effort: Pulmonary effort is normal.  Musculoskeletal:     Cervical back: Full passive range of motion without pain, normal range of motion and neck supple. No edema, erythema, signs of trauma, rigidity, torticollis or crepitus. No pain with movement, spinous process tenderness or muscular tenderness. Normal range of motion.  Skin:    General: Skin is warm and dry.  Neurological:     General: No focal deficit present.     Mental Status: He is  alert.  Psychiatric:        Behavior: Behavior is cooperative.     ED Results / Procedures / Treatments   Labs (all labs ordered are listed, but only abnormal results are displayed) Labs Reviewed - No data to display  EKG None  Radiology No results found.  Procedures Procedures    Medications Ordered in ED Medications - No data to display  ED Course/ Medical Decision Making/ A&P                           Medical Decision Making Risk Prescription drug management.   Alert 31 year old male in no acute distress, nontoxic-appearing.  Presents to the ED with a chief complaint of dental pain.  Information was obtained from patient.  I reviewed patient's past medical records including previous provider notes, labs, and imaging.  Patient has medical history as outlined in HPI which complicates his care.  Per chart review patient was seen on 01/07/2022 for similar complaints of dental pain.  Patient was found to have numerous dental caries with no obvious fluctuance or significant gingival swelling.  Patient was started on Augmentin, received Toradol and viscous lidocaine in the emergency department with improvement in his symptoms.  Patient's physical exam is reassuring for no acute gingival swelling, no abscess, Ludwick's angina, or deep space neck infection.  Suspect that patient's pain is secondary to dental caries.  We will give patient Toradol injection in the emergency department.  We will prescribe patient with course of viscous lidocaine.  Discussed holding NSAID medication for the next 24 hours.  Patient will use Tylenol and ibuprofen as needed for pain.  Patient to follow-up with dentist in the outpatient setting.  Patient to continue taking antibiotics.  Based on patient's chief complaint, I considered admission might be necessary, however after reassuring ED workup feel patient is reasonable for discharge.  Discussed results, findings, treatment and follow up. Patient  advised of return precautions. Patient verbalized understanding and agreed with plan.  Portions of this note were generated with Scientist, clinical (histocompatibility and immunogenetics). Dictation errors may occur despite best attempts at proofreading.         Final Clinical Impression(s) / ED Diagnoses Final diagnoses:  None    Rx / DC Orders ED Discharge Orders     None         Berneice Heinrich 01/13/22 1925    Lonell Grandchild, MD 01/13/22 2222

## 2022-01-13 NOTE — Discharge Instructions (Addendum)
You came to the emergency department today to be evaluated for your dental pain.  Your physical exam did show cavities.  It is very important that you continue to take the antibiotic prescribed and follow-up with a dentist.  Please hold any ibuprofen or other NSAID medication for the next 24 hours.  After that you may take this medication normally.  I have given you prescription for viscous lidocaine, please swish this around your mouth and spit it out.  If you swallow this medication you can only use it every 6 hours.  If you do not swallow you can use it as often as needed.  Please take Ibuprofen (Advil, motrin) and Tylenol (acetaminophen) to relieve your pain.    You may take up to 600 MG (3 pills) of normal strength ibuprofen every 8 hours as needed.   You make take tylenol, up to 1,000 mg (two extra strength pills) every 8 hours as needed.   It is safe to take ibuprofen and tylenol at the same time as they work differently.   Do not take more than 3,000 mg tylenol in a 24 hour period (not more than one dose every 8 hours.  Please check all medication labels as many medications such as pain and cold medications may contain tylenol.  Do not drink alcohol while taking these medications.  Do not take other NSAID'S while taking ibuprofen (such as aleve or naproxen).  Please take ibuprofen with food to decrease stomach upset.  Get help right away if: You are unable to open your mouth. You are having trouble breathing or swallowing. You have a fever. You notice that your face, neck, or jaw is swollen.

## 2022-04-11 ENCOUNTER — Emergency Department (HOSPITAL_COMMUNITY)
Admission: EM | Admit: 2022-04-11 | Discharge: 2022-04-11 | Disposition: A | Discharge status: Home / Self Care | Payer: Self-pay | Attending: Emergency Medicine | Admitting: Emergency Medicine

## 2022-04-11 ENCOUNTER — Other Ambulatory Visit: Payer: Self-pay

## 2022-04-11 ENCOUNTER — Encounter (HOSPITAL_COMMUNITY): Payer: Self-pay

## 2022-04-11 DIAGNOSIS — K0889 Other specified disorders of teeth and supporting structures: Secondary | ICD-10-CM | POA: Insufficient documentation

## 2022-04-11 MED ORDER — AMOXICILLIN-POT CLAVULANATE 875-125 MG PO TABS: 1.0000 | ORAL_TABLET | Freq: Two times a day (BID) | ORAL | 0 refills | Status: AC

## 2022-04-11 NOTE — Discharge Instructions (Signed)
Take the Augmentin twice daily with food and water for the next week.  Call schedule appoint with your dentist.  If you are unable to get in with them in a reasonable time you can also try [facility].  Return to the ED for any difficulty swallowing, facial swelling, new or concerning symptoms.

## 2022-04-11 NOTE — ED Provider Notes (Signed)
[facility] EMERGENCY DEPARTMENT Provider Note   CSN: [id number] Arrival date & time: 04/11/22  1423     History  Chief Complaint  Patient presents with   mouth abscess    Aaron Francis is a 31 y.o. male.  HPI   Patient presents due to dental pain.  He is concerned he may have an abscess to the roof of his mouth.  It all started yesterday, denies any fevers or chills or drainage.  He has been unable to follow-up with a dentist but states he currently has a dentist.  Denies any fevers at home, facial swelling, dysphagia.  Home Medications Prior to Admission medications   Medication Sig Start Date End Date Taking? Authorizing Provider  amoxicillin-clavulanate (AUGMENTIN) 875-125 MG tablet Take 1 tablet by mouth every 12 (twelve) hours. 04/11/22   Theron Arista, PA-C  HYDROcodone-acetaminophen (NORCO/VICODIN) 5-325 MG tablet Take 1 tablet by mouth every 4 (four) hours as needed. 07/30/21   Jacalyn Lefevre, MD  ibuprofen (ADVIL) 600 MG tablet Take 1 tablet (600 mg total) by mouth every 6 (six) hours as needed. 07/30/21   Jacalyn Lefevre, MD  lidocaine (XYLOCAINE) 2 % solution Use as directed 15 mLs in the mouth or throat as needed for mouth pain. Swish and spit medication, if you swallow the medication you can only take it every 6 hours.  If you do not swallow medication you can use it as often as needed. 01/13/22   Haskel Schroeder, PA-C      Allergies    Patient has no known allergies.    Review of Systems   Review of Systems  Physical Exam Updated Vital Signs BP (!) 136/103 (BP Location: Right Arm)   Pulse (!) 51   Temp 98.3 F (36.8 C) (Oral)   Resp 17   Ht 5\' 11"  (1.803 m)   Wt 68.9 kg   SpO2 100%   BMI 21.20 kg/m  Physical Exam Vitals and nursing note reviewed. Exam conducted with a chaperone present.  Constitutional:      Appearance: Normal appearance.  HENT:     Head: Normocephalic.     Mouth/Throat:     Mouth: Mucous membranes are moist.     Pharynx:  No oropharyngeal exudate or posterior oropharyngeal erythema.     Comments: Very poor dentition with multiple dental caries.  Uvula is midline, protecting airway and handling secretions.  Slight indurated area to the roof of the mouth but not fluctuant.  No sublingual swelling or tenderness Eyes:     Extraocular Movements: Extraocular movements intact.     Pupils: Pupils are equal, round, and reactive to light.  Cardiovascular:     Rate and Rhythm: Normal rate and regular rhythm.  Pulmonary:     Effort: Pulmonary effort is normal.     Breath sounds: Normal breath sounds.  Musculoskeletal:     Cervical back: Normal range of motion. No rigidity or tenderness.  Neurological:     Mental Status: He is alert.  Psychiatric:        Mood and Affect: Mood normal.     ED Results / Procedures / Treatments   Labs (all labs ordered are listed, but only abnormal results are displayed) Labs Reviewed - No data to display  EKG None  Radiology No results found.  Procedures Procedures    Medications Ordered in ED Medications - No data to display  ED Course/ Medical Decision Making/ A&P  Medical Decision Making Risk Prescription drug management.   This is a 31 year old male presenting today with dental pain/pain on the roof of his mouth.  On exam he has multiple dental caries and a slightly indurated area to roof of hard pallet which is not fluctuant so I do not think this would actually be amenable to an I&D at this time.   Considered Ludwig angina, retropharyngeal abscess, peritonsillar abscess but based on exam low suspicion. No SIRS, not septic or toxic appearing.  Discussed with patient the need to follow-up with a dentist.  Will cover with antibiotic at this time but discussed this is a temporary measure.  Return precautions were discussed with the patient who verbalized understanding.        Final Clinical Impression(s) / ED Diagnoses Final  diagnoses:  Pain, dental    Rx / DC Orders ED Discharge Orders          Ordered    amoxicillin-clavulanate (AUGMENTIN) 875-125 MG tablet  Every 12 hours        04/11/22 1524              Theron Arista, PA-C 04/11/22 Janett Labella, MD 04/13/22 0001

## 2022-04-11 NOTE — ED Triage Notes (Signed)
Patient arrives POV c/o abscess on upper palate of mouth; pt states he noticed it yesterday and states he has not been evaluated by a dentist.

## 2023-03-05 ENCOUNTER — Emergency Department (HOSPITAL_COMMUNITY)
Admission: EM | Admit: 2023-03-05 | Discharge: 2023-03-05 | Disposition: A | Discharge status: Home / Self Care | Payer: Medicaid Other | Attending: Emergency Medicine | Admitting: Emergency Medicine

## 2023-03-05 ENCOUNTER — Other Ambulatory Visit: Payer: Self-pay

## 2023-03-05 ENCOUNTER — Encounter (HOSPITAL_COMMUNITY): Payer: Self-pay

## 2023-03-05 DIAGNOSIS — K0889 Other specified disorders of teeth and supporting structures: Secondary | ICD-10-CM | POA: Diagnosis not present

## 2023-03-05 DIAGNOSIS — I10 Essential (primary) hypertension: Secondary | ICD-10-CM | POA: Diagnosis not present

## 2023-03-05 DIAGNOSIS — R030 Elevated blood-pressure reading, without diagnosis of hypertension: Secondary | ICD-10-CM

## 2023-03-05 MED ORDER — HYDROCODONE-ACETAMINOPHEN 5-325 MG PO TABS
1.0000 | ORAL_TABLET | Freq: Once | ORAL | Status: AC
  Administered 2023-03-05: 1 via ORAL
  Filled 2023-03-05: qty 1

## 2023-03-05 MED ORDER — AMOXICILLIN 500 MG PO CAPS: 500.0000 mg | ORAL_CAPSULE | Freq: Three times a day (TID) | ORAL | 0 refills | Status: AC

## 2023-03-05 MED ORDER — NAPROXEN 500 MG PO TABS: 500.0000 mg | ORAL_TABLET | Freq: Two times a day (BID) | ORAL | 0 refills | Status: AC

## 2023-03-05 MED ORDER — AMOXICILLIN 250 MG PO CAPS
500.0000 mg | ORAL_CAPSULE | Freq: Once | ORAL | Status: AC
  Administered 2023-03-05: 500 mg via ORAL
  Filled 2023-03-05: qty 2

## 2023-03-05 NOTE — ED Provider Notes (Addendum)
[facility] EMERGENCY DEPARTMENT AT [facility] Provider Note   CSN: [id number] Arrival date & time: 03/05/23  4098     History  Chief Complaint  Patient presents with   Dental Pain    Aaron Francis is a 32 y.o. male.  To the ER with right upper dental pain that started last night.  He had problems with this tooth in the past, states he finally got Medicaid so plans to see a dentist that does not have an appointment yet.  No trouble swallowing or breathing, no fevers or chills.  Pain is worse with chewing but is overall a constant aching pain.  He took ibuprofen 100 mg morning without significant relief.  I swelling of his neck either of his mouth.   Dental Pain      Home Medications Prior to Admission medications   Medication Sig Start Date End Date Taking? Authorizing Provider  amoxicillin-clavulanate (AUGMENTIN) 875-125 MG tablet Take 1 tablet by mouth every 12 (twelve) hours. 04/11/22   Theron Arista, PA-C  HYDROcodone-acetaminophen (NORCO/VICODIN) 5-325 MG tablet Take 1 tablet by mouth every 4 (four) hours as needed. 07/30/21   Jacalyn Lefevre, MD  ibuprofen (ADVIL) 600 MG tablet Take 1 tablet (600 mg total) by mouth every 6 (six) hours as needed. 07/30/21   Jacalyn Lefevre, MD  lidocaine (XYLOCAINE) 2 % solution Use as directed 15 mLs in the mouth or throat as needed for mouth pain. Swish and spit medication, if you swallow the medication you can only take it every 6 hours.  If you do not swallow medication you can use it as often as needed. 01/13/22   Haskel Schroeder, PA-C      Allergies    Patient has no known allergies.    Review of Systems   Review of Systems  Physical Exam Updated Vital Signs BP (!) 152/108   Pulse 69   Temp 98.6 F (37 C) (Oral)   Ht 5\' 11"  (1.803 m)   Wt 64.9 kg   SpO2 97%   BMI 19.94 kg/m  Physical Exam Vitals and nursing note reviewed.  Constitutional:      General: He is not in acute distress.    Appearance: He is  well-developed.  HENT:     Head: Normocephalic and atraumatic.     Mouth/Throat:     Mouth: Mucous membranes are moist.     Pharynx: Oropharynx is clear. Uvula midline.     Tonsils: No tonsillar exudate or tonsillar abscesses.   Eyes:     Conjunctiva/sclera: Conjunctivae normal.  Neck:     Comments: Right anterior cervical adenopathy Cardiovascular:     Rate and Rhythm: Normal rate and regular rhythm.     Heart sounds: No murmur heard. Pulmonary:     Effort: Pulmonary effort is normal. No respiratory distress.     Breath sounds: Normal breath sounds.  Abdominal:     Palpations: Abdomen is soft.     Tenderness: There is no abdominal tenderness.  Musculoskeletal:        General: No swelling.     Cervical back: Normal range of motion and neck supple.  Skin:    General: Skin is warm and dry.     Capillary Refill: Capillary refill takes less than 2 seconds.  Neurological:     General: No focal deficit present.     Mental Status: He is alert and oriented to person, place, and time.  Psychiatric:  Mood and Affect: Mood normal.     ED Results / Procedures / Treatments   Labs (all labs ordered are listed, but only abnormal results are displayed) Labs Reviewed - No data to display  EKG None  Radiology No results found.  Procedures Procedures    Medications Ordered in ED Medications - No data to display  ED Course/ Medical Decision Making/ A&P                                 Medical Decision Making Ddx: dental abscess, dental caries, dental fracture, alveolar osteitis, ludwigs angina, other ED course: Patient present with pain in the right upper second molar. They speak in full and clear sentences,  handle their oral secretions well. There is no sign of deep space abscess. There is no drainable collection.  Pain management: Pt given Norco for pain control in the ED, will be sent home with naproxen Antibiotics provided for dental infection, patient was provided  with outpatient dental resources and advised to come back to the ED for any new or worsening symptoms. Pt noted to have  high blood pressure today. They have no symptoms, including no chest pain, SOB, vision change, numbness/tingling/weakness. They were made aware of the value and the need to follow up with primary care, and to return immediately if they develop any symptoms.    Risk Prescription drug management.           Final Clinical Impression(s) / ED Diagnoses Final diagnoses:  None    Rx / DC Orders ED Discharge Orders     None         Ma Rings, PA-C 03/05/23 1014    [address], [place] A, PA-C 03/05/23 1016    Tanda Rockers A, DO 03/05/23 1317

## 2023-03-05 NOTE — ED Triage Notes (Signed)
Pt came in with c/c of right sided dental pain that includes the gum and tooth. Pain rated 9/10 took 800mg  ibuprofen roughly six hours ag0

## 2023-03-05 NOTE — Discharge Instructions (Signed)
Is make sure to follow-up closely with a dentist.  Of note today her blood pressure was also elevated.  This may be partially due to pain but need to have this rechecked with primary care. Please go back to ER for any new or worsening symptoms.  You are given prescription for naproxen.  You can also take over-the-counter Tylenol.  Do not take ibuprofen, Aleve or aspirin with the naproxen as they are similar

## 2023-06-18 DIAGNOSIS — L0293 Carbuncle, unspecified: Secondary | ICD-10-CM | POA: Diagnosis not present

## 2023-12-26 ENCOUNTER — Emergency Department (HOSPITAL_COMMUNITY)

## 2023-12-26 ENCOUNTER — Other Ambulatory Visit: Payer: Self-pay

## 2023-12-26 ENCOUNTER — Encounter (HOSPITAL_COMMUNITY): Payer: Self-pay | Admitting: Emergency Medicine

## 2023-12-26 ENCOUNTER — Emergency Department (HOSPITAL_COMMUNITY)
Admission: EM | Admit: 2023-12-26 | Discharge: 2023-12-26 | Disposition: A | Discharge status: Home / Self Care | Attending: Emergency Medicine | Admitting: Emergency Medicine

## 2023-12-26 DIAGNOSIS — X509XXA Other and unspecified overexertion or strenuous movements or postures, initial encounter: Secondary | ICD-10-CM | POA: Diagnosis not present

## 2023-12-26 DIAGNOSIS — R0781 Pleurodynia: Secondary | ICD-10-CM | POA: Diagnosis not present

## 2023-12-26 DIAGNOSIS — M940 Chondrocostal junction syndrome [Tietze]: Secondary | ICD-10-CM | POA: Insufficient documentation

## 2023-12-26 DIAGNOSIS — R059 Cough, unspecified: Secondary | ICD-10-CM | POA: Diagnosis not present

## 2023-12-26 MED ORDER — KETOROLAC TROMETHAMINE 30 MG/ML IJ SOLN
30.0000 mg | Freq: Once | INTRAMUSCULAR | Status: AC
  Administered 2023-12-26: 30 mg via INTRAMUSCULAR
  Filled 2023-12-26: qty 1

## 2023-12-26 MED ORDER — IBUPROFEN 600 MG PO TABS: 600.0000 mg | ORAL_TABLET | Freq: Four times a day (QID) | ORAL | 0 refills | Status: AC | PRN

## 2023-12-26 NOTE — ED Triage Notes (Signed)
 Pt here for c/o L sided rib pain. States he was getting off the back of a truck on Thursday evening and felt a pop in his L side. States pain worse with movement, coughing, and taking a deep breath.

## 2023-12-26 NOTE — Discharge Instructions (Addendum)
 You are seen today for rib pain.  Your x-rays are negative.  You may have some inflammation in the ribs.  Take ibuprofen  as needed for pain.

## 2023-12-26 NOTE — ED Provider Notes (Signed)
 [place] EMERGENCY DEPARTMENT AT [facility] Provider Note   CSN: [id number] Arrival date & time: 12/26/23  0025     Patient presents with: Pain in Rib   Aaron Francis is a 33 y.o. male.   HPI     This is a 33 year old male who presents with concerns for left-sided rib pain.  States that he was getting out of a truck on Thursday when he hopped down and felt a pop on the left side of his rib cage.  Since that time he has had pain.  He has taken Tylenol  with minimal relief.  Reports no shortness of breath.  But does report pain is worse with movement, coughing, deep breathing.  Prior to Admission medications   Medication Sig Start Date End Date Taking? Authorizing Provider  ibuprofen  (ADVIL ) 600 MG tablet Take 1 tablet (600 mg total) by mouth every 6 (six) hours as needed. 12/26/23  Yes Patriece Archbold, Charmaine FALCON, MD  amoxicillin  (AMOXIL ) 500 MG capsule Take 1 capsule (500 mg total) by mouth 3 (three) times daily. 03/05/23   Suellen Cantor A, PA-C  amoxicillin -clavulanate (AUGMENTIN ) 875-125 MG tablet Take 1 tablet by mouth every 12 (twelve) hours. 04/11/22   Emelia Sluder, PA-C  HYDROcodone -acetaminophen  (NORCO/VICODIN) 5-325 MG tablet Take 1 tablet by mouth every 4 (four) hours as needed. 07/30/21   Dean Clarity, MD  lidocaine  (XYLOCAINE ) 2 % solution Use as directed 15 mLs in the mouth or throat as needed for mouth pain. Swish and spit medication, if you swallow the medication you can only take it every 6 hours.  If you do not swallow medication you can use it as often as needed. 01/13/22   Eudelia Maude SAUNDERS, PA-C  naproxen  (NAPROSYN ) 500 MG tablet Take 1 tablet (500 mg total) by mouth 2 (two) times daily. 03/05/23   Suellen Cantor A, PA-C    Allergies: Patient has no known allergies.    Review of Systems  Respiratory:  Negative for shortness of breath.   Cardiovascular:  Positive for chest pain.  All other systems reviewed and are negative.   Updated Vital  Signs BP 128/86 (BP Location: Left Arm)   Pulse 89   Temp 98.2 F (36.8 C) (Oral)   Resp 16   Ht 1.803 m (5' 11)   Wt 68.9 kg   SpO2 96%   BMI 21.20 kg/m   Physical Exam Vitals and nursing note reviewed.  Constitutional:      Appearance: He is well-developed.  HENT:     Head: Normocephalic and atraumatic.  Eyes:     Pupils: Pupils are equal, round, and reactive to light.  Cardiovascular:     Rate and Rhythm: Normal rate and regular rhythm.     Heart sounds: Normal heart sounds. No murmur heard. Pulmonary:     Effort: Pulmonary effort is normal. No respiratory distress.     Breath sounds: Normal breath sounds. No wheezing.     Comments: Left anterior lower chest wall tenderness palpation, no overlying skin changes or crepitus Chest:     Chest wall: Tenderness present.  Abdominal:     Palpations: Abdomen is soft.     Tenderness: There is no abdominal tenderness.  Musculoskeletal:     Cervical back: Neck supple.  Lymphadenopathy:     Cervical: No cervical adenopathy.  Skin:    General: Skin is warm and dry.  Neurological:     Mental Status: He is alert and oriented to person, place, and time.  Psychiatric:        Mood and Affect: Mood normal.     (all labs ordered are listed, but only abnormal results are displayed) Labs Reviewed - No data to display  EKG: None  Radiology: DG Ribs Unilateral W/Chest Left Result Date: 12/26/2023 EXAM: 3 VIEW(S) XRAY OF THE LEFT RIBS AND CHEST 12/26/2023 01:21:00 AM COMPARISON: 07/10/2011 CLINICAL HISTORY: Pain. Per triage Pt here for c/o L sided rib pain. States he was getting off the back of a truck on Thursday evening and felt a pop in his L side. States pain worse with movement, coughing, and taking a deep breath. FINDINGS: BONES: No acute displaced rib fracture. LUNGS AND PLEURA: No consolidation or pulmonary edema. No pleural effusion or pneumothorax. HEART AND MEDIASTINUM: No acute abnormality of the cardiac and mediastinal  silhouettes. IMPRESSION: 1. No acute left rib fracture. Electronically signed by: Franky Stanford MD 12/26/2023 01:26 AM EDT RP Workstation: [id number]     Procedures   Medications Ordered in the ED  ketorolac  (TORADOL ) 30 MG/ML injection 30 mg (has no administration in time range)                                    Medical Decision Making Amount and/or Complexity of Data Reviewed Radiology: ordered.  Risk Prescription drug management.   This patient presents to the ED for concern of left-sided rib pain, this involves an extensive number of treatment options, and is a complaint that carries with it a high risk of complications and morbidity.  I considered the following differential and admission for this acute, potentially life threatening condition.  The differential diagnosis includes muscle strain, costochondritis, rib fracture, pneumothorax  MDM:    This is a 33 year old male who presents with 2-day history of rib pain.  Worse with movements and coughing.  No blunt force injury.  X-ray without evidence of rib fracture or pneumothorax.  Suspect costochondritis or strain.  Will start on anti-inflammatories.  He is otherwise well-appearing, satting 100% on my evaluation.  (Labs, imaging, consults)  Labs: I Ordered, and personally interpreted labs.  The pertinent results include: None  Imaging Studies ordered: I ordered imaging studies including rib x-ray I independently visualized and interpreted imaging. I agree with the radiologist interpretation  Additional history obtained from chart review.  External records from outside source obtained and reviewed including prior evaluations  Cardiac Monitoring: The patient was not maintained on a cardiac monitor.  If on the cardiac monitor, I personally viewed and interpreted the cardiac monitored which showed an underlying rhythm of: N/A  Reevaluation: After the interventions noted above, I reevaluated the patient and found that they  have :stayed the same  Social Determinants of Health:  lives independently  Disposition: Discharge  Co morbidities that complicate the patient evaluation History reviewed. No pertinent past medical history.   Medicines Meds ordered this encounter  Medications   ketorolac  (TORADOL ) 30 MG/ML injection 30 mg   ibuprofen  (ADVIL ) 600 MG tablet    Sig: Take 1 tablet (600 mg total) by mouth every 6 (six) hours as needed.    Dispense:  30 tablet    Refill:  0    I have reviewed the patients home medicines and have made adjustments as needed  Problem List / ED Course: Problem List Items Addressed This Visit   None Visit Diagnoses       Costochondritis, acute    -  Primary                Final diagnoses:  Costochondritis, acute    ED Discharge Orders          Ordered    ibuprofen  (ADVIL ) 600 MG tablet  Every 6 hours PRN        12/26/23 0138               Bari Charmaine FALCON, MD 12/26/23 [phone]
